# Patient Record
Sex: Female | Born: 1965 | Race: Black or African American | Hispanic: No | Marital: Married | Smoking: Never smoker
Health system: Southern US, Community
[De-identification: ages and names within clinical notes are randomized; demographics above are authoritative.]

## PROBLEM LIST (undated history)

## (undated) DIAGNOSIS — B3789 Other sites of candidiasis: Secondary | ICD-10-CM

## (undated) DIAGNOSIS — R102 Pelvic and perineal pain: Secondary | ICD-10-CM

## (undated) DIAGNOSIS — R358 Other polyuria: Secondary | ICD-10-CM

## (undated) DIAGNOSIS — O9102 Infection of nipple associated with the puerperium: Secondary | ICD-10-CM

## (undated) DIAGNOSIS — N946 Dysmenorrhea, unspecified: Secondary | ICD-10-CM

## (undated) HISTORY — DX: Dysmenorrhea, unspecified: N94.6

## (undated) HISTORY — DX: Other sites of candidiasis: B37.89

## (undated) HISTORY — PX: WISDOM TOOTH EXTRACTION: SHX21

## (undated) HISTORY — DX: Pelvic and perineal pain: R10.2

## (undated) HISTORY — PX: DILATION AND CURETTAGE OF UTERUS: SHX78

## (undated) HISTORY — DX: Other polyuria: R35.8

## (undated) HISTORY — DX: Infection of nipple associated with the puerperium: O91.02

---

## 2002-12-07 ENCOUNTER — Emergency Department (HOSPITAL_COMMUNITY): Admission: EM | Admit: 2002-12-07 | Discharge: 2002-12-07 | Payer: Self-pay | Admitting: Emergency Medicine

## 2004-03-31 ENCOUNTER — Ambulatory Visit (HOSPITAL_COMMUNITY): Admission: RE | Admit: 2004-03-31 | Discharge: 2004-03-31 | Payer: Self-pay | Admitting: Obstetrics and Gynecology

## 2004-08-16 ENCOUNTER — Inpatient Hospital Stay (HOSPITAL_COMMUNITY): Admission: AD | Admit: 2004-08-16 | Discharge: 2004-08-17 | Payer: Self-pay | Admitting: Obstetrics and Gynecology

## 2004-08-21 ENCOUNTER — Inpatient Hospital Stay (HOSPITAL_COMMUNITY): Admission: AD | Admit: 2004-08-21 | Discharge: 2004-08-23 | Payer: Self-pay | Admitting: Obstetrics and Gynecology

## 2004-08-28 ENCOUNTER — Inpatient Hospital Stay (HOSPITAL_COMMUNITY): Admission: AD | Admit: 2004-08-28 | Discharge: 2004-08-28 | Payer: Self-pay | Admitting: Obstetrics and Gynecology

## 2004-08-29 DIAGNOSIS — R3589 Other polyuria: Secondary | ICD-10-CM

## 2004-08-29 HISTORY — DX: Other polyuria: R35.89

## 2004-09-21 DIAGNOSIS — R102 Pelvic and perineal pain: Secondary | ICD-10-CM

## 2004-09-21 HISTORY — DX: Pelvic and perineal pain: R10.2

## 2004-10-23 ENCOUNTER — Other Ambulatory Visit: Admission: RE | Admit: 2004-10-23 | Discharge: 2004-10-23 | Payer: Self-pay | Admitting: Obstetrics and Gynecology

## 2005-03-09 DIAGNOSIS — N946 Dysmenorrhea, unspecified: Secondary | ICD-10-CM

## 2005-03-09 HISTORY — DX: Dysmenorrhea, unspecified: N94.6

## 2009-07-11 ENCOUNTER — Emergency Department (HOSPITAL_BASED_OUTPATIENT_CLINIC_OR_DEPARTMENT_OTHER): Admission: EM | Admit: 2009-07-11 | Discharge: 2009-07-11 | Payer: Self-pay | Admitting: Emergency Medicine

## 2010-12-03 ENCOUNTER — Emergency Department (HOSPITAL_BASED_OUTPATIENT_CLINIC_OR_DEPARTMENT_OTHER)
Admission: EM | Admit: 2010-12-03 | Discharge: 2010-12-04 | Disposition: A | Discharge status: Home / Self Care | Payer: Self-pay | Attending: Emergency Medicine | Admitting: Emergency Medicine

## 2010-12-03 DIAGNOSIS — R079 Chest pain, unspecified: Secondary | ICD-10-CM | POA: Insufficient documentation

## 2010-12-03 DIAGNOSIS — R252 Cramp and spasm: Secondary | ICD-10-CM | POA: Insufficient documentation

## 2010-12-04 ENCOUNTER — Emergency Department (INDEPENDENT_AMBULATORY_CARE_PROVIDER_SITE_OTHER): Payer: Self-pay

## 2010-12-04 DIAGNOSIS — R079 Chest pain, unspecified: Secondary | ICD-10-CM

## 2010-12-04 LAB — BASIC METABOLIC PANEL
GFR calc non Af Amer: >60 mL/min (ref >60)
Potassium: 4 mEq/L (ref 3.5–5.1)

## 2010-12-04 LAB — CBC
Hemoglobin: 12.3 g/dL (ref 12.0–15.0)
MCH: 30.8 pg (ref 26.0–34.0)
RDW: 11.8 % (ref 11.5–15.5)
WBC: 5.6 10*3/uL (ref 4.0–10.5)

## 2010-12-04 LAB — POCT CARDIAC MARKERS: Troponin i, poc: <0.05 ng/mL (ref 0.00–0.09)

## 2011-02-06 NOTE — H&P (Signed)
NAMERAYETTE, MOGG NO.:  [id number]   MEDICAL RECORD NO.:  [id number]          PATIENT TYPE:  MAT   LOCATION:  [facility]                          FACILITY:  [facility]   PHYSICIAN:  Osborn Coho, M.D.   DATE OF BIRTH:  10/28/1965   DATE OF ADMISSION:  08/21/2004  DATE OF DISCHARGE:                                HISTORY & PHYSICAL   HISTORY OF PRESENT ILLNESS:  This is a 45 year old gravida 3 para 1-0-1-1 at  63 and two-sevenths weeks who presents for labor evaluation.  She denies  leaking or bleeding, reports positive fetal movement.  Pregnancy has been  followed by Dr. Pennie Rushing and remarkable for:  1.  Unsure LMP.  2.  AMA.  3.  History of postpartum depression.  4.  History of premature labor.  5.  Migraines.  6.  Group B strep negative.   OBSTETRICAL HISTORY:  Remarkable for an unsure type of abortion - it is not  indicated whether it is elective or spontaneous - in 2001 requiring a D&C.  She had a vaginal delivery in 2002 of  female infant at [redacted] weeks gestation  weighing 8 pounds 14 ounces, remarkable for preterm labor at 22 weeks which  dilated to 1 cm, but then she delivered at postterm.   MEDICAL HISTORY:  Remarkable for history of postpartum depression after her  last baby, history of premature labor, childhood varicella, history of  anemia as a teenager, occasional UTIs, history of migraines for which she  requires no medications, history of questionable depression in the past.   FAMILY HISTORY:  Remarkable for mother with MI, father and mother with  hypertension, cousin with asthma, grandmother and aunt with diabetes,  sisters with thyroid dysfunction of unknown type, cousin with breast cancer,  and a cousin with schizophrenia.   GENETIC HISTORY:  Remarkable for advanced maternal age of 56, father-of-the-  baby's sister with sickle cell trait, and several family members with twins.   SOCIAL HISTORY:  Remarkable for a D&C in 2001 and childbirth in  2002.   SOCIAL HISTORY:  The patient is married to [facility] who is  involved and supportive.  She is of the [place] faith.  She denies any  alcohol, tobacco, or drug use.   ALLERGIES:  None.   MEDICATIONS:  Prenatal vitamins.   PRENATAL LABORATORY DATA:  Hemoglobin 12.6, platelets 410.  Blood type B  positive, antibody screen negative.  RPR nonreactive.  Rubella immune.  Hepatitis negative,  HIV negative.  Cystic fibrosis negative.  Parvo  negative.  Gonorrhea negative, chlamydia negative.  Group B strep negative.   HISTORY OF CURRENT PREGNANCY:  The patient entered care at [redacted] weeks  gestation.  She declined amniocentesis.  She did have nuchal translucency  done at [facility].  She had a normal ultrasound at 20 weeks.  She had some  increased weight gain secondary to dietary indiscretions.  She had some  pelvic pressure at 23 weeks but no preterm labor.  She had a negative group  B strep.   OBJECTIVE DATA:  VITAL SIGNS:  Stable,  afebrile.  HEENT:  Within normal limits.  Thyroid normal, not enlarged.  CHEST:  Clear to auscultation.  HEART:  Regular rate and rhythm.  ABDOMEN:  Gravid at 40 cm, vertex to Leopold's.  EFM shows a reactive fetal  heart rate with uterine contractions every 2 minutes.  PELVIC:  Cervix was initially 3 cm, 80%, and -2, and 3 hours later was 4.5  cm, 90% effaced, and -1 to -2 station with a vertex presentation.  EXTREMITIES:  Within normal limits.   ASSESSMENT:  1.  Intrauterine pregnancy at 40 and two-sevenths weeks.  2.  Active labor.  3.  Group B streptococcus negative.  4.  Desires epidural.   PLAN:  1.  Admit to birthing suites per Dr. Su Hilt.  2.  Routine M.D. orders.  3.  Epidural p.r.n.     Mari   MLW/MEDQ  D:  08/21/2004  T:  08/21/2004  Job:  [id number]

## 2011-12-17 ENCOUNTER — Encounter: Payer: Self-pay | Admitting: Obstetrics and Gynecology

## 2011-12-25 ENCOUNTER — Ambulatory Visit: Payer: Self-pay | Admitting: Obstetrics and Gynecology

## 2012-01-29 ENCOUNTER — Telehealth: Payer: Self-pay | Admitting: Obstetrics and Gynecology

## 2012-01-29 NOTE — Telephone Encounter (Signed)
Lm on vm to cb per telephone call.  

## 2012-01-29 NOTE — Telephone Encounter (Signed)
Triage/pts cht is in storage!

## 2012-04-28 ENCOUNTER — Encounter: Payer: Self-pay | Admitting: Obstetrics and Gynecology

## 2012-05-06 ENCOUNTER — Ambulatory Visit: Payer: BC Managed Care – PPO | Admitting: Obstetrics and Gynecology

## 2012-05-06 NOTE — Progress Notes (Unsigned)
EPIC DOWN 3:50 PM 05/06/12

## 2012-07-15 ENCOUNTER — Ambulatory Visit (INDEPENDENT_AMBULATORY_CARE_PROVIDER_SITE_OTHER): Payer: BC Managed Care – PPO | Admitting: Obstetrics and Gynecology

## 2012-07-15 ENCOUNTER — Encounter: Payer: Self-pay | Admitting: Obstetrics and Gynecology

## 2012-07-15 VITALS — BP 120/78 | HR 80 | Wt 193.0 lb

## 2012-07-15 DIAGNOSIS — Z30433 Encounter for removal and reinsertion of intrauterine contraceptive device: Secondary | ICD-10-CM

## 2012-07-15 DIAGNOSIS — Z113 Encounter for screening for infections with a predominantly sexual mode of transmission: Secondary | ICD-10-CM

## 2012-07-15 DIAGNOSIS — IMO0001 Reserved for inherently not codable concepts without codable children: Secondary | ICD-10-CM

## 2012-07-15 DIAGNOSIS — Z309 Encounter for contraceptive management, unspecified: Secondary | ICD-10-CM

## 2012-07-15 MED ORDER — LEVONORGESTREL 20 MCG/24HR IU IUD
INTRAUTERINE_SYSTEM | Freq: Once | INTRAUTERINE | Status: AC
  Administered 2012-07-15: 1 via INTRAUTERINE

## 2012-07-15 NOTE — Patient Instructions (Addendum)
Call [facility] [phone]:  -for temperature of 100.4 degrees Fahrenheit or more -pain not improved with over the counter pain medications (Ibuprofen, Advil, Aleve,        Tylenol or acetaminophen) -for excessive bleeding (more than a usual period) -for any other concerns  Do not place anything in your vagina for the next 7 days    

## 2012-07-15 NOTE — Progress Notes (Signed)
IUD INSERTION NOTE  Jean Dominguez is a 46 y.o. female G3P2 who presents for IUD removal and re- insertion.  Consent signed after risks and benefits were reviewed including but not limited to bleeding, infection, expulsion and risk of uterine perforation that may require an additional procedure for removal.  LMP: No LMP recorded. Patient is not currently having periods (Reason: IUD). UPT: negative  Uterus assessed for size and position IUD removed without difficulty per protocol Prepped with Hibiclens  Tenaculum placed on anterior lip of cervix after Hurricane gel was applied Uterus sounded at  7 cm Insertion of MIRENA IUD per protocol without any complications Strings trimmed  Assessment:  IUD Removal and Mirena Re-Insertion  Plan:  1. Patient instructed to call with oral temperature of 100.4 degrees Fahrenheit or more, excessive bleeding or pain that is not relieved with OTC analgesia taken as directed  2. Patient instructed on how  to check IUD strings and encouraged to do so after each menstrual cycle  3. Advised not to place anything in vagina or have sexual intercourse for 7 days  4. Follow-up: 4  weeks   Evadean Sproule PA-C 07/15/2012 11:32 AM

## 2012-07-20 LAB — GC/CHLAMYDIA PROBE AMP
CT Probe RNA: NEGATIVE
GC Probe RNA: NEGATIVE

## 2012-08-16 ENCOUNTER — Encounter: Payer: Self-pay | Admitting: Obstetrics and Gynecology

## 2012-08-16 ENCOUNTER — Ambulatory Visit (INDEPENDENT_AMBULATORY_CARE_PROVIDER_SITE_OTHER): Payer: BC Managed Care – PPO | Admitting: Obstetrics and Gynecology

## 2012-08-16 VITALS — BP 120/64 | HR 70 | Resp 16 | Ht 68.0 in | Wt 191.0 lb

## 2012-08-16 DIAGNOSIS — Z124 Encounter for screening for malignant neoplasm of cervix: Secondary | ICD-10-CM

## 2012-08-16 DIAGNOSIS — Z975 Presence of (intrauterine) contraceptive device: Secondary | ICD-10-CM

## 2012-08-16 NOTE — Progress Notes (Signed)
Contraception Mirena IUD Last pap 2010 WNL  Last Mammo 2011 WNL Last Colonoscopy None Last Dexa Scan None Primary MD None Abuse at Home None  No complaints  Filed Vitals:   08/16/12 1502  BP: 120/64  Pulse: 70  Resp: 16   ROS: noncontributory  Physical Examination: General appearance - alert, well appearing, and in no distress Neck - supple, no significant adenopathy Chest - clear to auscultation, no wheezes, rales or rhonchi, symmetric air entry Heart - normal rate and regular rhythm Abdomen - soft, nontender, nondistended, no masses or organomegaly Breasts - breasts appear normal, no suspicious masses, no skin or nipple changes or axillary nodes Pelvic - normal external genitalia, vulva, vagina, cervix, uterus and adnexa, IUD string visible Back exam - no CVAT Extremities - no edema, redness or tenderness in the calves or thighs  A/P Pap today mammo Refer to PCP Dr. Allyne Gee Note for school

## 2012-08-17 LAB — PAP IG W/ RFLX HPV ASCU

## 2014-04-17 ENCOUNTER — Ambulatory Visit (INDEPENDENT_AMBULATORY_CARE_PROVIDER_SITE_OTHER): Payer: BC Managed Care – PPO | Admitting: Podiatry

## 2014-04-17 ENCOUNTER — Encounter: Payer: Self-pay | Admitting: Podiatry

## 2014-04-17 VITALS — BP 137/75 | HR 66 | Ht 68.0 in | Wt 203.0 lb

## 2014-04-17 DIAGNOSIS — M79609 Pain in unspecified limb: Secondary | ICD-10-CM

## 2014-04-17 DIAGNOSIS — M722 Plantar fascial fibromatosis: Secondary | ICD-10-CM

## 2014-04-17 DIAGNOSIS — M216X9 Other acquired deformities of unspecified foot: Secondary | ICD-10-CM

## 2014-04-17 DIAGNOSIS — M21969 Unspecified acquired deformity of unspecified lower leg: Secondary | ICD-10-CM | POA: Insufficient documentation

## 2014-04-17 NOTE — Progress Notes (Signed)
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  Started to be tired with aching feet off and on for a year, and progressively getting worse. In a couple of weeks, she will be going back to work. During the summer while she was not on her feet as much, still it did not get better. Especially after exercise, feet are very painful.  Been getting good shoes. In the morning gets pain and also after been on feet. Aching feet but not enough to stop her from doing things. Not taking any medication. When she got a shot from sinus problem her feet felt better.  Mostly on heel and sometimes in arch R>L.   Objective: Neurovascular status are within normal. Dermatologic: Long nails without any skin lesions. Orthopedic: High arched cavus type foot. Excess sagittal plane motion of the first ray upon loading of forefoot bilateral.  X-ray: Supinated high arched cavus foot with mild Metatarsus adductus bilateral. No significant osseous deviations noted in X-ray.   Assessment: Plantar fasciitis bilateral. Metatarsal deformity bilateral. Cavus foot bilateral.  Plan: Reviewed findings. Night Splint dispensed x 2.  Both feet casted for Orthotics.

## 2014-04-17 NOTE — Patient Instructions (Signed)
Seen for painful heel both feet. Treated for plantar fasciitis with Night Splints and [employer].

## 2014-07-23 ENCOUNTER — Encounter: Payer: Self-pay | Admitting: Podiatry

## 2015-11-19 ENCOUNTER — Encounter (HOSPITAL_BASED_OUTPATIENT_CLINIC_OR_DEPARTMENT_OTHER): Payer: Self-pay

## 2015-11-19 ENCOUNTER — Emergency Department (HOSPITAL_BASED_OUTPATIENT_CLINIC_OR_DEPARTMENT_OTHER)
Admission: EM | Admit: 2015-11-19 | Discharge: 2015-11-20 | Disposition: A | Discharge status: Home / Self Care | Payer: BC Managed Care – PPO | Attending: Emergency Medicine | Admitting: Emergency Medicine

## 2015-11-19 ENCOUNTER — Emergency Department (HOSPITAL_BASED_OUTPATIENT_CLINIC_OR_DEPARTMENT_OTHER): Payer: BC Managed Care – PPO

## 2015-11-19 DIAGNOSIS — R51 Headache: Secondary | ICD-10-CM | POA: Diagnosis not present

## 2015-11-19 DIAGNOSIS — R200 Anesthesia of skin: Secondary | ICD-10-CM | POA: Insufficient documentation

## 2015-11-19 LAB — DIFFERENTIAL
BASOS PCT: 1 %
Basophils Absolute: 0 10*3/uL (ref 0.0–0.1)
EOS PCT: 3 %
Eosinophils Absolute: 0.2 10*3/uL (ref 0.0–0.7)
Lymphocytes Relative: 48 %
Lymphs Abs: 2.9 10*3/uL (ref 0.7–4.0)
MONO ABS: 0.6 10*3/uL (ref 0.1–1.0)
Monocytes Relative: 9 %
NEUTROS ABS: 2.4 10*3/uL (ref 1.7–7.7)
NEUTROS PCT: 39 %

## 2015-11-19 LAB — CBC
HCT: 38.1 % (ref 36.0–46.0)
Hemoglobin: 13.1 g/dL (ref 12.0–15.0)
MCH: 31 pg (ref 26.0–34.0)
MCHC: 34.4 g/dL (ref 30.0–36.0)
MCV: 90.1 fL (ref 78.0–100.0)
PLATELETS: 359 10*3/uL (ref 150–400)
RBC: 4.23 MIL/uL (ref 3.87–5.11)
RDW: 12.2 % (ref 11.5–15.5)
WBC: 6.1 10*3/uL (ref 4.0–10.5)

## 2015-11-19 NOTE — ED Notes (Signed)
Pt states she drove herself here. While at a funeral an hour ago she felt numbness in her face. She is ambulatory. Neuro intact.

## 2015-11-19 NOTE — ED Notes (Signed)
Pt is not sitting in the waiting area. No witness of her leaving.

## 2015-11-19 NOTE — ED Provider Notes (Signed)
TIME SEEN: 11:30 PM  CHIEF COMPLAINT: Left-sided facial and arm numbness  HPI: Pt is a 50 y.o. female with history of migraines and sinus headaches that she states she's had for "years" who presents to the emergency department with left-sided facial numbness that started at 6:30 PM while at a funeral this migrated down her arm on the left side. No weakness. No chest pain or shortness of breath. No changes in her speech. No changes in vision or hearing. Denies any headache. Not on anticoagulation. Denies any recent head injury. Has never had a complicated migraine before.  ROS: See HPI Constitutional: no fever  Eyes: no drainage  ENT: no runny nose   Cardiovascular:  no chest pain  Resp: no SOB  GI: no vomiting GU: no dysuria Integumentary: no rash  Allergy: no hives  Musculoskeletal: no leg swelling  Neurological: no slurred speech ROS otherwise negative  PAST MEDICAL HISTORY/PAST SURGICAL HISTORY:  Past Medical History  Diagnosis Date  . Dysmenorrhea 03/09/05  . Yeast infection of nipple, postpartum     breast candida  . Diuresis 08/29/04  . Suprapubic pressure 2006     MEDICATIONS:  Prior to Admission medications   Medication Sig Start Date End Date Taking? Authorizing Provider  fexofenadine (ALLEGRA) 180 MG tablet Take 180 mg by mouth as needed.     Historical Provider, MD  fluticasone Aleda Grana) 50 MCG/ACT nasal spray  04/12/14   Historical Provider, MD  Pseudoephedrine-DM-GG (SUDAFED COUGH PO) Take by mouth.    Historical Provider, MD  Vitamin D, Ergocalciferol, (DRISDOL) 50000 UNITS CAPS capsule  04/12/14   Historical Provider, MD    ALLERGIES:  Allergies  Allergen Reactions  . Macrobid [Nitrofurantoin Macrocrystal] Itching  . Shellfish Allergy     SOCIAL HISTORY:  Social History  Substance Use Topics  . Smoking status: Never Smoker   . Smokeless tobacco: Never Used  . Alcohol Use: Yes    FAMILY HISTORY: Family History  Problem Relation Age of Onset  . Heart  disease Mother     MI  . Hypertension Mother   . Hypertension Father     EXAM: BP 116/57 mmHg  Pulse 72  Temp(Src) 98.5 F (36.9 C) (Oral)  Resp 20  Ht  (1.727 m)  Wt 190 lb (86.183 kg)  BMI 28.90 kg/m2  SpO2 100% CONSTITUTIONAL: Alert and oriented and responds appropriately to questions. Well-appearing; well-nourished HEAD: Normocephalic EYES: Conjunctivae clear, PERRL, extraocular movements intact ENT: normal nose; no rhinorrhea; moist mucous membranes NECK: Supple, no meningismus, no LAD  CARD: RRR; S1 and S2 appreciated; no murmurs, no clicks, no rubs, no gallops RESP: Normal chest excursion without splinting or tachypnea; breath sounds clear and equal bilaterally; no wheezes, no rhonchi, no rales, no hypoxia or respiratory distress, speaking full sentences ABD/GI: Normal bowel sounds; non-distended; soft, non-tender, no rebound, no guarding, no peritoneal signs BACK:  The back appears normal and is non-tender to palpation, there is no CVA tenderness EXT: Normal ROM in all joints; non-tender to palpation; no edema; normal capillary refill; no cyanosis, no calf tenderness or swelling    SKIN: Normal color for age and race; warm; no rash NEURO: Moves all extremities equally, reports that she feels decreased sensation in the left face and reports she feels tingling in the left arm but states normal sensation when I palpate the left and right arms, normal sensation in bilateral lower extremities, cranial nerves II through XII intact, strength is 5/5 in all 4 extremities, no aphasia or  dysarthria. PSYCH: The patient's mood and manner are appropriate. Grooming and personal hygiene are appropriate.  MEDICAL DECISION MAKING: Patient here with concerning symptoms of left-sided facial numbness and left arm numbness. She has no history of hypertension, diabetes, hyperlipidemia, tobacco use, CAD, prior stroke. No prior history of complicated migraine and she denies headache today. We'll  proceed with labs, CT of her head, EKG. Her NIH stroke scale would be between 1 and 2 and have discussed with her that she is right within the window for TPA. Discussed risk and benefits. Given her low NIH stroke scale I do not feel she is a candidate for TPA and she feels like the tingling in her left hand is improving. She agrees with this plan and states she would prefer to not have TPA.  ED PROGRESS: Patient's labs, head CT unremarkable. Reports that the tingling in her left hand is completely resolved but she is still having tingling throughout the rest of the left arm and numbness in the left face. Still not having a headache. We'll discuss with neurology on call for further recommendations.   1:30 AM  D/w Dr. Laurell Josephs with neurology who recommends MRI brain without contrast tonight given continued symptoms.   1:40 AM  D/w Dr. Bebe Shaggy with ED who agrees to accept patient.  Pt and husband would like transfer to [facility] for MRI by POV.  I feel comfortable with this plan for her to go by private vehicle. Have offered her and her husband to go by [facility] several times but they decline. They state they will go straight to [facility]. Plan is to receive a brain MRI without contrast. If this is normal she will be discharged home with outpatient follow-up.     EKG Interpretation  Date/Time:  Tuesday November 19 2015 23:56:45 EST Ventricular Rate:  66 PR Interval:  123 QRS Duration: 86 QT Interval:  424 QTC Calculation: 444 R Axis:   52 Text Interpretation:  Sinus rhythm Baseline wander in lead(s) III No significant change since last tracing Confirmed by WARD,  DO, KRISTEN [phone]) on 11/20/2015 12:18:35 AM        Layla Maw Ward, DO 11/20/15 [id number]

## 2015-11-19 NOTE — ED Notes (Signed)
Pt was at a funeral and started having numbness from jaw down neck and through shoulder on left side that started about 2 hours ago.  The numbness is progressing down arm.  Pt denies SOB, denies chest pain, pt's neuro checks are normal in triage other than numbness on left side.

## 2015-11-19 NOTE — ED Notes (Signed)
She is anxious. Asked me if she should leave and see her MD tomorrow. I advised her to stay and be seen by the MD.

## 2015-11-19 NOTE — ED Notes (Signed)
Spoke with Dr. Clarene Duke about patient and whether she needs a CT head while waiting.  Pt does not have any risk factors and other than numbness to face and arm, pt is neurologically WNL. Dr. Clarene Duke declines need for CT at this time.

## 2015-11-20 ENCOUNTER — Emergency Department (HOSPITAL_COMMUNITY): Payer: BC Managed Care – PPO

## 2015-11-20 ENCOUNTER — Encounter (HOSPITAL_COMMUNITY): Payer: Self-pay

## 2015-11-20 LAB — COMPREHENSIVE METABOLIC PANEL
ALBUMIN: 4.6 g/dL (ref 3.5–5.0)
ALK PHOS: 61 U/L (ref 38–126)
ALT: 17 U/L (ref 14–54)
ANION GAP: 9 (ref 5–15)
AST: 19 U/L (ref 15–41)
BILIRUBIN TOTAL: 0.6 mg/dL (ref 0.3–1.2)
BUN: 9 mg/dL (ref 6–20)
CALCIUM: 9.5 mg/dL (ref 8.9–10.3)
CO2: 26 mmol/L (ref 22–32)
Chloride: 102 mmol/L (ref 101–111)
Creatinine, Ser: 0.87 mg/dL (ref 0.44–1.00)
GFR calc Af Amer: >60 mL/min (ref >60)
GFR calc non Af Amer: >60 mL/min (ref >60)
GLUCOSE: 90 mg/dL (ref 65–99)
Potassium: 3.4 mmol/L — ABNORMAL LOW (ref 3.5–5.1)
Sodium: 137 mmol/L (ref 135–145)
TOTAL PROTEIN: 8 g/dL (ref 6.5–8.1)

## 2015-11-20 LAB — URINALYSIS, ROUTINE W REFLEX MICROSCOPIC
Bilirubin Urine: NEGATIVE
GLUCOSE, UA: NEGATIVE mg/dL
HGB URINE DIPSTICK: NEGATIVE
KETONES UR: NEGATIVE mg/dL
LEUKOCYTES UA: NEGATIVE
Nitrite: NEGATIVE
PROTEIN: NEGATIVE mg/dL
Specific Gravity, Urine: 1.009 (ref 1.005–1.030)
pH: 7.5 (ref 5.0–8.0)

## 2015-11-20 LAB — TROPONIN I

## 2015-11-20 LAB — PROTIME-INR
INR: 0.95 (ref 0.00–1.49)
PROTHROMBIN TIME: 12.9 s (ref 11.6–15.2)

## 2015-11-20 LAB — APTT: aPTT: 30 seconds (ref 24–37)

## 2015-11-20 NOTE — ED Notes (Signed)
Pt from [facility] accompanied by her husband; was seen there for numbness to left side of her face and it radiates down to her left arm that started at 1830 last night. Pt also reports HA behind her left eye. She was sent here from [facility] to have MRI done. Pt A&Ox4, speaking clear complete sentences and no neuro deficits noted upon arrival to [facility].

## 2015-11-20 NOTE — ED Notes (Signed)
Pt taken to MRI  

## 2015-11-20 NOTE — ED Provider Notes (Signed)
Pt sent from [facility] for MRI imaging Pt reports left facial numbness and left UE numbness No focal weakness noted No arm drift No facial droop Mri pending at this time   Zadie Rhine, MD 11/20/15 [phone]

## 2015-11-20 NOTE — ED Provider Notes (Signed)
Mri negative Pt without any new symptoms Will d/c home F/u info given to patient   Zadie Rhine, MD 11/20/15 [phone]

## 2015-12-02 ENCOUNTER — Telehealth (HOSPITAL_BASED_OUTPATIENT_CLINIC_OR_DEPARTMENT_OTHER): Payer: Self-pay | Admitting: Emergency Medicine

## 2015-12-03 ENCOUNTER — Telehealth (HOSPITAL_BASED_OUTPATIENT_CLINIC_OR_DEPARTMENT_OTHER): Payer: Self-pay | Admitting: Emergency Medicine

## 2016-07-08 ENCOUNTER — Encounter: Payer: Self-pay | Admitting: Internal Medicine

## 2016-07-08 ENCOUNTER — Ambulatory Visit (INDEPENDENT_AMBULATORY_CARE_PROVIDER_SITE_OTHER): Payer: BC Managed Care – PPO | Admitting: Internal Medicine

## 2016-07-08 VITALS — BP 118/80 | HR 73 | Temp 98.1°F | Resp 16 | Ht 68.0 in | Wt 194.0 lb

## 2016-07-08 DIAGNOSIS — J309 Allergic rhinitis, unspecified: Secondary | ICD-10-CM | POA: Insufficient documentation

## 2016-07-08 DIAGNOSIS — Z23 Encounter for immunization: Secondary | ICD-10-CM

## 2016-07-08 DIAGNOSIS — R42 Dizziness and giddiness: Secondary | ICD-10-CM | POA: Diagnosis not present

## 2016-07-08 DIAGNOSIS — J3089 Other allergic rhinitis: Secondary | ICD-10-CM

## 2016-07-08 NOTE — Patient Instructions (Addendum)
  Flu vaccine administered today.   Medications reviewed and updated.  No changes recommended at this time.   Please followup annually for a physical exam

## 2016-07-08 NOTE — Assessment & Plan Note (Signed)
Dizziness/lightheadedness/off balanced feeling - likely a combination of bifocal glasses, allergies, dehydration or possible postural lightheadedness at times She has tried different things and her symptoms are getting better No concerning neurological or cardiac symptoms She will continue to work on the above and return if symptoms worsen/do not improve

## 2016-07-08 NOTE — Progress Notes (Signed)
Pre visit review using our clinic review tool, if applicable. No additional management support is needed unless otherwise documented below in the visit note. 

## 2016-07-08 NOTE — Progress Notes (Signed)
Subjective:    Patient ID: Jean Dominguez, female    DOB: 1966/03/28, 50 y.o.   MRN: [id number]  HPI She is here to establish with a new pcp.    She feels off balanced at times and wonders if it is related to allergies or her vision.  She describes the feeling of being off balance is somewhat like dizziness/lightheadedness or just feeling off balanced. 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 She has noticed a with sitting and standing and there is no specific pattern.  She has always worn contacts, but as she has gotten older has developed worsening vision for reading. Not long ago she got glasses that are bifocals and she wonders if this is somewhat better cause for her symptoms. She was initially wearing the contacts during the day and then putting the glasses on at night. Over the past few weeks she has tried wearing the glasses continuously and sometimes she can have the unbalanced or dizzy feeling with the glasses. She thinks that is improving.  She also has chronic seasonal allergies. She is taking Flonase, but does not want to take any other medication. She is trying to do some nocturnal remedies and is hoping that will help. She wonders if the dizziness/lightheadedness/feeling off balanced feeling is related to allergies in her ears feeling congested.  Her last PCP told her at one point she was close to being a diabetic. She will return in 1 month for follow-up and her doctor denied that she was close to being a diabetic. She was concerned if this was a possibility and this was potentially causing her symptoms.  At times they dizziness/lightheadedness can be worse with going from a sitting to standing position. 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 She is trying to increase  her fluids.  She did see her previous primary care physician regarding the dizziness/lightheadedness/unbalanced feeling. She did have workup including blood work earlier this year. Her symptoms started approximately one year ago. At one point she had some weakness on one side of her body was concerned she was having a stroke. She did have imaging at that time and everything was normal. She has not had any recurrence of the weakness and believes it may have been secondary to massage therapy she was getting for tight muscles in her neck and upper back.    Medications and allergies reviewed with patient and updated if appropriate.  Patient Active Problem List   Diagnosis Date Noted  . Allergic rhinitis 07/08/2016  . Dizziness 07/08/2016  . Plantar fasciitis, bilateral 04/17/2014  . 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 IUD contraception 08/16/2012    Current Outpatient Prescriptions on File Prior to Visit  Medication Sig Dispense Refill  . Cholecalciferol (VITAMIN D3) 2000 units capsule Take 1 capsule by mouth daily.     No current facility-administered medications on file prior to visit.     Past Medical History:  Diagnosis Date  . Diuresis 08/29/04  . Dysmenorrhea 03/09/05  . Suprapubic pressure 2006   . Yeast infection of nipple, postpartum    breast candida    Past Surgical History:  Procedure Laterality Date  . DILATION AND CURETTAGE OF UTERUS    . WISDOM TOOTH EXTRACTION      Social History  Social History  . Marital status: Married    Spouse name: N/A  . Number of children: N/A  . Years of education: N/A   Social History Main Topics  . Smoking status: Never Smoker  . Smokeless tobacco: Never Used  . Alcohol use Yes  . Drug use: No  . Sexual activity: Yes    Birth control/ protection: Condom, IUD   Other Topics Concern  . None   Social History Narrative   Teacher       Family History  Problem Relation Age of Onset  . Heart disease Mother     MI  .  Hypertension Mother   . Hypertension Father     Review of Systems  Constitutional: Negative for chills and fever.  HENT: Positive for congestion and postnasal drip.   Eyes: Negative for visual disturbance.  Respiratory: Positive for cough (dry, ? PND). Negative for shortness of breath and wheezing.   Cardiovascular: Negative for chest pain, palpitations and leg swelling.  Gastrointestinal: Negative for abdominal pain, blood in stool, constipation and diarrhea.       Occ GERD  Neurological: Positive for dizziness, light-headedness and headaches (sinus related).  Psychiatric/Behavioral: The patient is nervous/anxious.        Objective:   Vitals:   07/08/16 1431  BP: 118/80  Pulse: 73  Resp: 16  Temp: 98.1 F (36.7 C)   Filed Weights   07/08/16 1431  Weight: 194 lb (88 kg)   Body mass index is 29.5 kg/m.   Physical Exam Constitutional: She appears well-developed and well-nourished. No distress.  HENT:  Head: Normocephalic and atraumatic.  Right Ear: External ear normal. Normal ear canal and TM Left Ear: External ear normal.  Normal ear canal and TM Mouth/Throat: Oropharynx is clear and moist.  Eyes: Conjunctivae and EOM are normal.  Neck: Neck supple. No tracheal deviation present. No thyromegaly present.  No carotid bruit  Cardiovascular: Normal rate, regular rhythm and normal heart sounds.   No murmur heard.  No edema. Pulmonary/Chest: Effort normal and breath sounds normal. No respiratory distress. She has no wheezes. She has no rales.  Breast: deferred to Gyn Abdominal: Soft. She exhibits no distension. There is no tenderness.  Lymphadenopathy: She has no cervical adenopathy.  Neurologic: Gait normal, cranial nerves II-XII intact, normal sensation and strength all extremities Skin: Skin is warm and dry. She is not diaphoretic.  Psychiatric: She has a normal mood and affect. Her behavior is normal.         Assessment & Plan:   See Problem List for  Assessment and Plan of chronic medical problems.    Follow up annually

## 2016-07-08 NOTE — Assessment & Plan Note (Addendum)
Taking flonase Has not tolerated claritin Will try natural remedies

## 2018-10-02 NOTE — Progress Notes (Signed)
    Subjective:    Patient ID: Jean Dominguez, female    DOB: 1965/12/30, 53 y.o.   MRN: [id number]  HPI    Patient Active Problem List   Diagnosis Date Noted  . Allergic rhinitis 07/08/2016  . Dizziness 07/08/2016  . Plantar fasciitis, bilateral 04/17/2014  . 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 IUD contraception 08/16/2012    Current Outpatient Medications on File Prior to Visit  Medication Sig Dispense Refill  . Cholecalciferol (VITAMIN D3) 2000 units capsule Take 1 capsule by mouth daily.    . fluticasone (FLONASE) 50 MCG/ACT nasal spray Place into both nostrils daily.     No current facility-administered medications on file prior to visit.     Past Medical History:  Diagnosis Date  . Diuresis 08/29/04  . Dysmenorrhea 03/09/05  . Suprapubic pressure 2006   . Yeast infection of nipple, postpartum    breast candida    Past Surgical History:  Procedure Laterality Date  . DILATION AND CURETTAGE OF UTERUS    . WISDOM TOOTH EXTRACTION      Social History   Socioeconomic History  . Marital status: Married    Spouse name: Not on file  . Number of children: Not on file  . Years of education: Not on file  . Highest education level: Not on file  Occupational History  . Not on file  Social Needs  . Financial resource strain: Not on file  . Food insecurity:    Worry: Not on file    Inability: Not on file  . Transportation needs:    Medical: Not on file    Non-medical: Not on file  Tobacco Use  . Smoking status: Never Smoker  . Smokeless tobacco: Never Used  Substance and Sexual Activity  . Alcohol use: Yes  . Drug use: No  . Sexual activity: Yes    Birth control/protection: Condom, I.U.D.  Lifestyle  . Physical activity:    Days per week: Not on file    Minutes per session: Not on file  . Stress: Not on file  Relationships  . Social connections:    Talks on phone: Not on file    Gets together: Not on file    Attends religious service: Not on file    Active  member of club or organization: Not on file    Attends meetings of clubs or organizations: Not on file    Relationship status: Not on file  Other Topics Concern  . Not on file  Social History Narrative   Teacher    Family History  Problem Relation Age of Onset  . Heart disease Mother        MI  . Hypertension Mother   . Hypertension Father     Review of Systems     Objective:  There were no vitals filed for this visit. There were no vitals filed for this visit. There is no height or weight on file to calculate BMI.  BP Readings from Last 3 Encounters:  07/08/16 118/80  11/20/15 134/69  04/17/14 137/75    Wt Readings from Last 3 Encounters:  07/08/16 194 lb (88 kg)  11/19/15 190 lb (86.2 kg)  04/17/14 203 lb (92.1 kg)     Physical Exam        Assessment & Plan:     This encounter was created in error - please disregard.

## 2018-10-03 ENCOUNTER — Encounter: Payer: BC Managed Care – PPO | Admitting: Internal Medicine

## 2018-10-13 NOTE — Progress Notes (Signed)
Subjective:    Patient ID: Jean Dominguez, female    DOB: [birth year], 53 y.o.   MRN: [id number]  HPI She is here for a physical exam.   She was last seen 06/2016, which was the only time I have seen her.    External hemorrhoids: She has had external hemorrhoids since the birth of her last child 9 years ago.  They have been inflamed for over the past few days after having more frequent, loose stools.  She is experiencing pain, itching and bleeding.  She has been taking warm showers and using Preparation H pads, which helped.  Hair loss: She has been experiencing hair loss on the top of her head.  She wondered if that was related to nutritional deficiency or aging.  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  She was unsure if it was related to low blood sugar, dehydration or her sinus issues.  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  She has tried allergy medications for her sinus congestion, but is not taking them regularly.  She typically does not feel the dizziness is much on the weekends.  She thinks the dizziness is related to movement.     Medications and allergies reviewed with patient and updated if appropriate.  Patient Active Problem List   Diagnosis Date Noted  . External hemorrhoids 10/14/2018  . Hair loss 10/14/2018  . Family history of diabetes mellitus 10/14/2018  . Allergic rhinitis 07/08/2016  . Dizziness 07/08/2016  . Plantar fasciitis, bilateral 04/17/2014  . 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 IUD contraception 08/16/2012    Current Outpatient Medications on File Prior to Visit  Medication Sig Dispense Refill  . Cholecalciferol (VITAMIN D3) 2000 units capsule Take 1 capsule by mouth daily.     No current facility-administered medications on file prior to  visit.     Past Medical History:  Diagnosis Date  . Diuresis 08/29/04  . Dysmenorrhea 03/09/05  . Suprapubic pressure 2006   . Yeast infection of nipple, postpartum    breast candida    Past Surgical History:  Procedure Laterality Date  . DILATION AND CURETTAGE OF UTERUS    . WISDOM TOOTH EXTRACTION      Social History   Socioeconomic History  . Marital status: Married    Spouse name: Not on file  . Number of children: Not on file  . Years of education: Not on file  . Highest education level: Not on file  Occupational History  . Not on file  Social Needs  . Financial resource strain: Not on file  . Food insecurity:    Worry: Not on file    Inability: Not on file  . Transportation needs:    Medical: Not on file    Non-medical: Not on file  Tobacco Use  . Smoking status: Never Smoker  . Smokeless tobacco: Never Used  Substance and Sexual Activity  . Alcohol use: Yes  . Drug use: No  . Sexual activity: Yes    Birth control/protection: Condom, I.U.D.  Lifestyle  . Physical activity:    Days per week: Not on file    Minutes per session: Not on file  . Stress: Not on file  Relationships  . Social connections:    Talks on phone:  Not on file    Gets together: Not on file    Attends religious service: Not on file    Active member of club or organization: Not on file    Attends meetings of clubs or organizations: Not on file    Relationship status: Not on file  Other Topics Concern  . Not on file  Social History Narrative   Teacher    Family History  Problem Relation Age of Onset  . Heart disease Mother        MI  . Hypertension Mother   . Hypertension Father   . Thyroid disease Sister     Review of Systems  Constitutional: Negative for chills and fever.  Eyes: Negative for visual disturbance.  Respiratory: Negative for cough, shortness of breath and wheezing.   Cardiovascular: Negative for chest pain, palpitations and leg swelling.  Gastrointestinal:  Positive for anal bleeding. Negative for abdominal pain, constipation, diarrhea and nausea.  Genitourinary: Negative for dysuria and hematuria.  Musculoskeletal: Negative for arthralgias.  Skin: Negative for rash.       Hair loss on top of head  Neurological: Positive for dizziness (transient but multiple a day, feels off balance) and headaches (sinus).  Psychiatric/Behavioral: Negative for dysphoric mood. The patient is not nervous/anxious.        Objective:   Vitals:   10/14/18 1540  BP: 134/90  Pulse: 67  Resp: 16  Temp: 98.5 F (36.9 C)  SpO2: 99%   Filed Weights   10/14/18 1540  Weight: 197 lb (89.4 kg)   Body mass index is 29.95 kg/m.  BP Readings from Last 3 Encounters:  10/14/18 134/90  07/08/16 118/80  11/20/15 134/69    Wt Readings from Last 3 Encounters:  10/14/18 197 lb (89.4 kg)  07/08/16 194 lb (88 kg)  11/19/15 190 lb (86.2 kg)     Physical Exam Constitutional: She appears well-developed and well-nourished. No distress.  HENT:  Head: Normocephalic and atraumatic.  Right Ear: External ear normal. Normal ear canal and TM Left Ear: External ear normal.  Normal ear canal and TM Mouth/Throat: Oropharynx is clear and moist.  Eyes: Conjunctivae and EOM are normal.  Neck: Neck supple. No tracheal deviation present. No thyromegaly present.  No carotid bruit  Cardiovascular: Normal rate, regular rhythm and normal heart sounds.   No murmur heard.  No edema. Pulmonary/Chest: Effort normal and breath sounds normal. No respiratory distress. She has no wheezes. She has no rales.  Breast: deferred Abdominal: Soft. She exhibits no distension. There is no tenderness. GU: Deferred Lymphadenopathy: She has no cervical adenopathy.  Skin: Skin is warm and dry. She is not diaphoretic.  Psychiatric: She has a normal mood and affect. Her behavior is normal.        Assessment & Plan:   Physical exam: Screening blood work  ordered Immunizations  Discussed  shingrix, tdap, flu Colonoscopy  Never had one -would like to do that this summer-referred Mammogram -due-we will schedule Gyn   due-we will schedule Eye exams  Up to date  EKG   Done 11/2015 Exercise none currently-stressed the importance of regular exercise Weight encouraged weight loss Skin   no skin concerns Substance abuse   none  See Problem List for Assessment and Plan of chronic medical problems.   Follow-up annually, sooner if needed

## 2018-10-13 NOTE — Patient Instructions (Addendum)
Tests ordered today. Your results will be released to [place] (or called to you) after review, usually within 72hours after test completion. If any changes need to be made, you will be notified at that same time.  All other Health Maintenance issues reviewed.   All recommended immunizations and age-appropriate screenings are up-to-date or discussed.  No immunizations administered today.   Medications reviewed and updated.  Changes include :   Cream for your hemorrhoids.    Your prescription(s) have been submitted to your pharmacy. Please take as directed and contact our office if you believe you are having problem(s) with the medication(s).   A referral was ordered for Gi and ENT  Please followup in one year     Health Maintenance, Female Adopting a healthy lifestyle and getting preventive care can go a long way to promote health and wellness. Talk with your health care provider about what schedule of regular examinations is right for you. This is a good chance for you to check in with your provider about disease prevention and staying healthy. In between checkups, there are plenty of things you can do on your own. Experts have done a lot of research about which lifestyle changes and preventive measures are most likely to keep you healthy. Ask your health care provider for more information. Weight and diet Eat a healthy diet  Be sure to include plenty of vegetables, fruits, low-fat dairy products, and lean protein.  Do not eat a lot of foods high in solid fats, added sugars, or salt.  Get regular exercise. This is one of the most important things you can do for your health. ? Most adults should exercise for at least 150 minutes each week. The exercise should increase your heart rate and make you sweat (moderate-intensity exercise). ? Most adults should also do strengthening exercises at least twice a week. This is in addition to the moderate-intensity exercise. Maintain a healthy  weight  Body mass index (BMI) is a measurement that can be used to identify possible weight problems. It estimates body fat based on height and weight. Your health care provider can help determine your BMI and help you achieve or maintain a healthy weight.  For females 65 years of age and older: ? A BMI below 18.5 is considered underweight. ? A BMI of 18.5 to 24.9 is normal. ? A BMI of 25 to 29.9 is considered overweight. ? A BMI of 30 and above is considered obese. Watch levels of cholesterol and blood lipids  You should start having your blood tested for lipids and cholesterol at 51 years of age, then have this test every 5 years.  You may need to have your cholesterol levels checked more often if: ? Your lipid or cholesterol levels are high. ? You are older than 51 years of age. ? You are at high risk for heart disease. Cancer screening Lung Cancer  Lung cancer screening is recommended for adults 70-34 years old who are at high risk for lung cancer because of a history of smoking.  A yearly low-dose CT scan of the lungs is recommended for people who: ? Currently smoke. ? Have quit within the past 15 years. ? Have at least a 30-pack-year history of smoking. A pack year is smoking an average of one pack of cigarettes a day for 1 year.  Yearly screening should continue until it has been 15 years since you quit.  Yearly screening should stop if you develop a health problem that would prevent  you from having lung cancer treatment. Breast Cancer  Practice breast self-awareness. This means understanding how your breasts normally appear and feel.  It also means doing regular breast self-exams. Let your health care provider know about any changes, no matter how small.  If you are in your 20s or 30s, you should have a clinical breast exam (CBE) by a health care provider every 1-3 years as part of a regular health exam.  If you are 53 or older, have a CBE every year. Also consider  having a breast X-ray (mammogram) every year.  If you have a family history of breast cancer, talk to your health care provider about genetic screening.  If you are at high risk for breast cancer, talk to your health care provider about having an MRI and a mammogram every year.  Breast cancer gene (BRCA) assessment is recommended for women who have family members with BRCA-related cancers. BRCA-related cancers include: ? Breast. ? Ovarian. ? Tubal. ? Peritoneal cancers.  Results of the assessment will determine the need for genetic counseling and BRCA1 and BRCA2 testing. Cervical Cancer Your health care provider may recommend that you be screened regularly for cancer of the pelvic organs (ovaries, uterus, and vagina). This screening involves a pelvic examination, including checking for microscopic changes to the surface of your cervix (Pap test). You may be encouraged to have this screening done every 3 years, beginning at age 29.  For women ages 38-65, health care providers may recommend pelvic exams and Pap testing every 3 years, or they may recommend the Pap and pelvic exam, combined with testing for human papilloma virus (HPV), every 5 years. Some types of HPV increase your risk of cervical cancer. Testing for HPV may also be done on women of any age with unclear Pap test results.  Other health care providers may not recommend any screening for nonpregnant women who are considered low risk for pelvic cancer and who do not have symptoms. Ask your health care provider if a screening pelvic exam is right for you.  If you have had past treatment for cervical cancer or a condition that could lead to cancer, you need Pap tests and screening for cancer for at least 20 years after your treatment. If Pap tests have been discontinued, your risk factors (such as having a new sexual partner) need to be reassessed to determine if screening should resume. Some women have medical problems that increase the  chance of getting cervical cancer. In these cases, your health care provider may recommend more frequent screening and Pap tests. Colorectal Cancer  This type of cancer can be detected and often prevented.  Routine colorectal cancer screening usually begins at 51 years of age and continues through 51 years of age.  Your health care provider may recommend screening at an earlier age if you have risk factors for colon cancer.  Your health care provider may also recommend using home test kits to check for hidden blood in the stool.  A small camera at the end of a tube can be used to examine your colon directly (sigmoidoscopy or colonoscopy). This is done to check for the earliest forms of colorectal cancer.  Routine screening usually begins at age 13.  Direct examination of the colon should be repeated every 5-10 years through 51 years of age. However, you may need to be screened more often if early forms of precancerous polyps or small growths are found. Skin Cancer  Check your skin from head to toe  regularly.  Tell your health care provider about any new moles or changes in moles, especially if there is a change in a mole's shape or color.  Also tell your health care provider if you have a mole that is larger than the size of a pencil eraser.  Always use sunscreen. Apply sunscreen liberally and repeatedly throughout the day.  Protect yourself by wearing long sleeves, pants, a wide-brimmed hat, and sunglasses whenever you are outside. Heart disease, diabetes, and high blood pressure  High blood pressure causes heart disease and increases the risk of stroke. High blood pressure is more likely to develop in: ? People who have blood pressure in the high end of the normal range (130-139/85-89 mm Hg). ? People who are overweight or obese. ? People who are African American.  If you are 50-20 years of age, have your blood pressure checked every 3-5 years. If you are 43 years of age or older,  have your blood pressure checked every year. You should have your blood pressure measured twice-once when you are at a hospital or clinic, and once when you are not at a hospital or clinic. Record the average of the two measurements. To check your blood pressure when you are not at a hospital or clinic, you can use: ? An automated blood pressure machine at a pharmacy. ? A home blood pressure monitor.  If you are between 107 years and 77 years old, ask your health care provider if you should take aspirin to prevent strokes.  Have regular diabetes screenings. This involves taking a blood sample to check your fasting blood sugar level. ? If you are at a normal weight and have a low risk for diabetes, have this test once every three years after 51 years of age. ? If you are overweight and have a high risk for diabetes, consider being tested at a younger age or more often. Preventing infection Hepatitis B  If you have a higher risk for hepatitis B, you should be screened for this virus. You are considered at high risk for hepatitis B if: ? You were born in a country where hepatitis B is common. Ask your health care provider which countries are considered high risk. ? Your parents were born in a high-risk country, and you have not been immunized against hepatitis B (hepatitis B vaccine). ? You have HIV or AIDS. ? You use needles to inject street drugs. ? You live with someone who has hepatitis B. ? You have had sex with someone who has hepatitis B. ? You get hemodialysis treatment. ? You take certain medicines for conditions, including cancer, organ transplantation, and autoimmune conditions. Hepatitis C  Blood testing is recommended for: ? Everyone born from [birth year] through 1963. ? Anyone with known risk factors for hepatitis C. Sexually transmitted infections (STIs)  You should be screened for sexually transmitted infections (STIs) including gonorrhea and chlamydia if: ? You are sexually active  and are younger than 51 years of age. ? You are older than 51 years of age and your health care provider tells you that you are at risk for this type of infection. ? Your sexual activity has changed since you were last screened and you are at an increased risk for chlamydia or gonorrhea. Ask your health care provider if you are at risk.  If you do not have HIV, but are at risk, it may be recommended that you take a prescription medicine daily to prevent HIV infection. This is called pre-exposure  prophylaxis (PrEP). You are considered at risk if: ? You are sexually active and do not regularly use condoms or know the HIV status of your partner(s). ? You take drugs by injection. ? You are sexually active with a partner who has HIV. Talk with your health care provider about whether you are at high risk of being infected with HIV. If you choose to begin PrEP, you should first be tested for HIV. You should then be tested every 3 months for as long as you are taking PrEP. Pregnancy  If you are premenopausal and you may become pregnant, ask your health care provider about preconception counseling.  If you may become pregnant, take 400 to 800 micrograms (mcg) of folic acid every day.  If you want to prevent pregnancy, talk to your health care provider about birth control (contraception). Osteoporosis and menopause  Osteoporosis is a disease in which the bones lose minerals and strength with aging. This can result in serious bone fractures. Your risk for osteoporosis can be identified using a bone density scan.  If you are 91 years of age or older, or if you are at risk for osteoporosis and fractures, ask your health care provider if you should be screened.  Ask your health care provider whether you should take a calcium or vitamin D supplement to lower your risk for osteoporosis.  Menopause may have certain physical symptoms and risks.  Hormone replacement therapy may reduce some of these symptoms  and risks. Talk to your health care provider about whether hormone replacement therapy is right for you. Follow these instructions at home:  Schedule regular health, dental, and eye exams.  Stay current with your immunizations.  Do not use any tobacco products including cigarettes, chewing tobacco, or electronic cigarettes.  If you are pregnant, do not drink alcohol.  If you are breastfeeding, limit how much and how often you drink alcohol.  Limit alcohol intake to no more than 1 drink per day for nonpregnant women. One drink equals 12 ounces of beer, 5 ounces of wine, or 1 ounces of hard liquor.  Do not use street drugs.  Do not share needles.  Ask your health care provider for help if you need support or information about quitting drugs.  Tell your health care provider if you often feel depressed.  Tell your health care provider if you have ever been abused or do not feel safe at home. This information is not intended to replace advice given to you by your health care provider. Make sure you discuss any questions you have with your health care provider. Document Released: 03/23/2011 Document Revised: 02/13/2016 Document Reviewed: 06/11/2015 Elsevier Interactive Patient Education  2019 [employer]. c

## 2018-10-14 ENCOUNTER — Encounter: Payer: Self-pay | Admitting: Internal Medicine

## 2018-10-14 ENCOUNTER — Ambulatory Visit (INDEPENDENT_AMBULATORY_CARE_PROVIDER_SITE_OTHER): Payer: BC Managed Care – PPO | Admitting: Internal Medicine

## 2018-10-14 ENCOUNTER — Other Ambulatory Visit (INDEPENDENT_AMBULATORY_CARE_PROVIDER_SITE_OTHER): Payer: BC Managed Care – PPO

## 2018-10-14 VITALS — BP 134/90 | HR 67 | Temp 98.5°F | Resp 16 | Ht 68.0 in | Wt 197.0 lb

## 2018-10-14 DIAGNOSIS — Z0000 Encounter for general adult medical examination without abnormal findings: Secondary | ICD-10-CM

## 2018-10-14 DIAGNOSIS — K644 Residual hemorrhoidal skin tags: Secondary | ICD-10-CM | POA: Insufficient documentation

## 2018-10-14 DIAGNOSIS — L659 Nonscarring hair loss, unspecified: Secondary | ICD-10-CM | POA: Diagnosis not present

## 2018-10-14 DIAGNOSIS — Z833 Family history of diabetes mellitus: Secondary | ICD-10-CM

## 2018-10-14 DIAGNOSIS — R0981 Nasal congestion: Secondary | ICD-10-CM

## 2018-10-14 DIAGNOSIS — R42 Dizziness and giddiness: Secondary | ICD-10-CM

## 2018-10-14 DIAGNOSIS — Z1211 Encounter for screening for malignant neoplasm of colon: Secondary | ICD-10-CM

## 2018-10-14 LAB — COMPREHENSIVE METABOLIC PANEL
ALBUMIN: 4.9 g/dL (ref 3.5–5.2)
ALK PHOS: 68 U/L (ref 39–117)
ALT: 15 U/L (ref 0–35)
AST: 15 U/L (ref 0–37)
BUN: 10 mg/dL (ref 6–23)
CHLORIDE: 100 meq/L (ref 96–112)
CO2: 24 mEq/L (ref 19–32)
CREATININE: 0.78 mg/dL (ref 0.40–1.20)
Calcium: 10.3 mg/dL (ref 8.4–10.5)
GFR: 94.5 mL/min (ref >60.00)
GLUCOSE: 97 mg/dL (ref 70–99)
POTASSIUM: 3.9 meq/L (ref 3.5–5.1)
SODIUM: 134 meq/L — AB (ref 135–145)
TOTAL PROTEIN: 8.2 g/dL (ref 6.0–8.3)
Total Bilirubin: 0.5 mg/dL (ref 0.2–1.2)

## 2018-10-14 LAB — CBC WITH DIFFERENTIAL/PLATELET
BASOS PCT: 1.2 % (ref 0.0–3.0)
Basophils Absolute: 0.1 10*3/uL (ref 0.0–0.1)
EOS ABS: 0.1 10*3/uL (ref 0.0–0.7)
EOS PCT: 1.2 % (ref 0.0–5.0)
HCT: 42.9 % (ref 36.0–46.0)
Hemoglobin: 14.5 g/dL (ref 12.0–15.0)
LYMPHS ABS: 1.9 10*3/uL (ref 0.7–4.0)
Lymphocytes Relative: 34.6 % (ref 12.0–46.0)
MCHC: 33.9 g/dL (ref 30.0–36.0)
MCV: 91.6 fl (ref 78.0–100.0)
Monocytes Absolute: 0.5 10*3/uL (ref 0.1–1.0)
Monocytes Relative: 8.1 % (ref 3.0–12.0)
NEUTROS PCT: 54.9 % (ref 43.0–77.0)
Neutro Abs: 3.1 10*3/uL (ref 1.4–7.7)
PLATELETS: 358 10*3/uL (ref 150.0–400.0)
RBC: 4.68 Mil/uL (ref 3.87–5.11)
RDW: 13 % (ref 11.5–15.5)
WBC: 5.6 10*3/uL (ref 4.0–10.5)

## 2018-10-14 LAB — HEMOGLOBIN A1C: Hgb A1c MFr Bld: 5.9 % (ref 4.6–6.5)

## 2018-10-14 LAB — LIPID PANEL
CHOLESTEROL: 240 mg/dL — AB (ref 0–200)
HDL: 57.9 mg/dL (ref >39.00)
LDL Cholesterol: 166 mg/dL — ABNORMAL HIGH (ref 0–99)
NonHDL: 181.97
TRIGLYCERIDES: 78 mg/dL (ref 0.0–149.0)
Total CHOL/HDL Ratio: 4
VLDL: 15.6 mg/dL (ref 0.0–40.0)

## 2018-10-14 MED ORDER — HYDROCORTISONE 2.5 % RE CREA: 1.0000 "application " | TOPICAL_CREAM | Freq: Two times a day (BID) | RECTAL | 5 refills | Status: AC

## 2018-10-15 DIAGNOSIS — R0981 Nasal congestion: Secondary | ICD-10-CM | POA: Insufficient documentation

## 2018-10-15 NOTE — Assessment & Plan Note (Signed)
Some hair loss top of head Will check B12, TSH Possibly related to menopause or getting older Can refer to Derm if needed

## 2018-10-15 NOTE — Assessment & Plan Note (Signed)
Present for 9 years Currently flared up She does not want to consider procedure at this time Hydrocortisone rectal cream Warm sits baths Will be referred for a colonoscopy and can discuss further with GI-she may benefit from banding

## 2018-10-15 NOTE — Assessment & Plan Note (Signed)
Chronic ?  Allergy related Discussed taking allergy medications consistently Also discussed removing carpets from her house and other ways of avoiding allergens Will refer to ENT for that and dizziness May need to see allergy

## 2018-10-15 NOTE — Assessment & Plan Note (Signed)
Check A1c Stressed regular exercise and low sugar/carbohydrate diet

## 2018-10-15 NOTE — Assessment & Plan Note (Signed)
Having intermittent dizziness, which is not new Dizziness is transient and occurs throughout the day-more during the workweek and on the weekends She has bifocals and that is likely contributing-we will discuss again with her ophthalmologist We will refer to ENT to rule out the possibility of BPPV Increase fluids during the day to see if that helps Unlikely related to hypoglycemia

## 2018-10-17 ENCOUNTER — Encounter: Payer: Self-pay | Admitting: Internal Medicine

## 2018-10-17 DIAGNOSIS — R7303 Prediabetes: Secondary | ICD-10-CM | POA: Insufficient documentation

## 2018-10-17 DIAGNOSIS — E785 Hyperlipidemia, unspecified: Secondary | ICD-10-CM | POA: Insufficient documentation

## 2018-10-17 LAB — VITAMIN B12: VITAMIN B 12: 556 pg/mL (ref 211–911)

## 2018-10-17 LAB — TSH: TSH: 1.86 u[IU]/mL (ref 0.35–4.50)

## 2018-10-28 ENCOUNTER — Encounter: Payer: BC Managed Care – PPO | Admitting: Internal Medicine

## 2018-12-29 ENCOUNTER — Encounter: Payer: Self-pay | Admitting: Internal Medicine

## 2021-08-29 ENCOUNTER — Emergency Department (HOSPITAL_BASED_OUTPATIENT_CLINIC_OR_DEPARTMENT_OTHER): Payer: BC Managed Care – PPO

## 2021-08-29 ENCOUNTER — Other Ambulatory Visit: Payer: Self-pay

## 2021-08-29 ENCOUNTER — Encounter (HOSPITAL_BASED_OUTPATIENT_CLINIC_OR_DEPARTMENT_OTHER): Payer: Self-pay

## 2021-08-29 ENCOUNTER — Emergency Department (HOSPITAL_BASED_OUTPATIENT_CLINIC_OR_DEPARTMENT_OTHER)
Admission: EM | Admit: 2021-08-29 | Discharge: 2021-08-29 | Disposition: A | Discharge status: Home / Self Care | Payer: BC Managed Care – PPO | Attending: Student | Admitting: Student

## 2021-08-29 DIAGNOSIS — M79652 Pain in left thigh: Secondary | ICD-10-CM | POA: Diagnosis not present

## 2021-08-29 DIAGNOSIS — R7989 Other specified abnormal findings of blood chemistry: Secondary | ICD-10-CM | POA: Diagnosis present

## 2021-08-29 DIAGNOSIS — R7303 Prediabetes: Secondary | ICD-10-CM | POA: Diagnosis not present

## 2021-08-29 MED ORDER — IOHEXOL 350 MG/ML SOLN
75.0000 mL | Freq: Once | INTRAVENOUS | Status: AC | PRN
  Administered 2021-08-29: 75 mL via INTRAVENOUS

## 2021-08-29 NOTE — ED Notes (Signed)
First contact with patient. Patient arrived via triage from home with complaints of feeling shaky and dizzy. Patient had blood work drawn and was informed to come to ED for elevated d-dimer. Patient denies symptoms at this time. Patient is A&OX 4. Respirations even/unlabored. Denies shortness of breath or dyspnea. Patient changed into gown and placed on cardiac monitor and call light within reach. Patient updated on plan of care. Will continue to monitor patient.

## 2021-08-29 NOTE — Discharge Instructions (Signed)
You are seen in the emergency department for evaluation of an isolated elevated D-dimer.  Scans performed in the emergency department were reassuringly negative for blood clots in your legs or lungs.  At this time you are safe for discharge.  Please return the emergency department if you have new or worsening shortness of breath, chest pain, fever or any other concerning symptoms.

## 2021-08-29 NOTE — ED Provider Notes (Signed)
[place] EMERGENCY DEPARTMENT Provider Note   CSN: [id number] Arrival date & time: 08/29/21  [id number]     History Chief Complaint  Patient presents with   elevated D-Dimer    Jean Dominguez is a 55 y.o. female who presents emergency department for evaluation of an elevated D-dimer obtained at urgent care.  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  Urgent care did a large laboratory panel and then sent the patient home prior to these labs resulting.  They then called the patient today and told the patient to come to the emergency department as her D-dimer was elevated.  Patient endorses mild left thigh pain that has been achy for a few weeks.  Denies smoking history or oral contraceptive use.  Denies long travel history.  Denies shortness of breath or pleuritic chest pain.  She does endorse mild indigestion-like symptoms today but arrives with a normal heart rate and satting 98% on room air.  Denies abdominal pain, diarrhea, cough, headache, fever or other systemic symptoms.  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   IUD contraception 08/16/2012    Past Surgical History:  Procedure Laterality Date   DILATION AND CURETTAGE OF UTERUS     WISDOM TOOTH EXTRACTION       OB History     Gravida  3   Para  2   Term      Preterm      AB      Living  2       SAB      IAB      Ectopic      Multiple      Live Births              Family History  Problem Relation Age of Onset   Heart disease Mother        MI   Hypertension Mother    Hypertension Father    Thyroid disease Sister     Social History   Tobacco Use   Smoking status: Never   Smokeless tobacco: Never  Substance Use Topics   Alcohol use: Yes   Drug use: No    Home Medications Prior to Admission medications   Medication Sig Start Date End Date Taking? Authorizing Provider  Cholecalciferol (VITAMIN D3) 2000 units capsule Take 1 capsule by mouth daily.    [provider]  hydrocortisone (ANUSOL-HC) 2.5 % rectal cream Place 1 application rectally 2 (two) times daily. 10/14/18   Binnie Rail, MD    Allergies    Macrobid [nitrofurantoin macrocrystal] and Shellfish allergy  Review of Systems   Review of Systems  Constitutional:  Negative for chills and fever.  HENT:  Negative for ear pain and sore throat.   Eyes:  Negative for pain and visual disturbance.  Respiratory:  Negative for cough and shortness  of breath.   Cardiovascular:  Negative for chest pain and palpitations.  Gastrointestinal:  Negative for abdominal pain and vomiting.  Genitourinary:  Negative for dysuria and hematuria.  Musculoskeletal:  Positive for myalgias. Negative for arthralgias and back pain.  Skin:  Negative for color change and rash.  Neurological:  Negative for seizures and syncope.  All other systems reviewed and are negative.  Physical Exam Updated Vital Signs BP 122/69   Pulse 66   Temp 98 F (36.7 C) (Oral)   Resp 20   Ht 5\' 8"  (1.727 m)   Wt 86.2 kg   LMP  (LMP Unknown)   SpO2 100%   BMI 28.89 kg/m   Physical Exam Vitals and nursing note reviewed.  Constitutional:      General: She is not in acute distress.    Appearance: She is well-developed.  HENT:     Head: Normocephalic and atraumatic.  Eyes:     Conjunctiva/sclera: Conjunctivae normal.   Cardiovascular:     Rate and Rhythm: Normal rate and regular rhythm.     Heart sounds: No murmur heard. Pulmonary:     Effort: Pulmonary effort is normal. No respiratory distress.     Breath sounds: Normal breath sounds.  Abdominal:     Palpations: Abdomen is soft.     Tenderness: There is no abdominal tenderness.  Musculoskeletal:        General: Tenderness (Lateral thigh, left) present. No swelling.     Cervical back: Neck supple.  Skin:    General: Skin is warm and dry.     Capillary Refill: Capillary refill takes less than 2 seconds.  Neurological:     Mental Status: She is alert.  Psychiatric:        Mood and Affect: Mood normal.    ED Results / Procedures / Treatments   Labs (all labs ordered are listed, but only abnormal results are displayed) Labs Reviewed - No data to display  EKG None  Radiology CT Angio Chest PE W and/or Wo Contrast  Result Date: 08/29/2021 CLINICAL DATA:  Positive D-dimer. Patient reports feeling shaky and dizzy. EXAM: CT ANGIOGRAPHY CHEST WITH CONTRAST TECHNIQUE: Multidetector CT imaging of the chest was performed using the standard protocol during bolus administration of intravenous contrast. Multiplanar CT image reconstructions and MIPs were obtained to evaluate the vascular anatomy. CONTRAST:  55mL OMNIPAQUE IOHEXOL 350 MG/ML SOLN COMPARISON:  None. FINDINGS: Cardiovascular: Satisfactory opacification of the pulmonary arteries to the segmental level. No evidence of pulmonary embolism. Normal heart size. No pericardial effusion. Mediastinum/Nodes: No enlarged mediastinal, hilar, or axillary lymph nodes. Thyroid gland, trachea, and esophagus demonstrate no significant findings. Lungs/Pleura: No pleural effusion, airspace consolidation, or pneumothorax. No suspicious lung nodule identified. Upper Abdomen: No acute abnormality. Musculoskeletal: No chest wall abnormality. No acute or significant osseous findings. Review of the MIP images confirms the  above findings. IMPRESSION: 1. No acute cardiopulmonary abnormalities. No evidence for acute pulmonary embolus. Electronically Signed   By: Kerby Moors M.D.   On: 08/29/2021 11:28   US Venous Img Lower  Left (DVT Study)  Result Date: 08/29/2021 CLINICAL DATA:  Left leg pain, elevated D-dimer EXAM: LEFT LOWER EXTREMITY VENOUS DOPPLER ULTRASOUND TECHNIQUE: Gray-scale sonography with graded compression, as well as color Doppler and duplex ultrasound were performed to evaluate the lower extremity deep venous systems from the level of the common femoral vein and including the common femoral, femoral, profunda femoral, popliteal and calf veins including the posterior tibial, peroneal and gastrocnemius veins  when visible. The superficial great saphenous vein was also interrogated. Spectral Doppler was utilized to evaluate flow at rest and with distal augmentation maneuvers in the common femoral, femoral and popliteal veins. COMPARISON:  None. FINDINGS: Contralateral Common Femoral Vein: Respiratory phasicity is normal and symmetric with the symptomatic side. No evidence of thrombus. Normal compressibility. Common Femoral Vein: No evidence of thrombus. Normal compressibility, respiratory phasicity and response to augmentation. Saphenofemoral Junction: No evidence of thrombus. Normal compressibility and flow on color Doppler imaging. Profunda Femoral Vein: No evidence of thrombus. Normal compressibility and flow on color Doppler imaging. Femoral Vein: No evidence of thrombus. Normal compressibility, respiratory phasicity and response to augmentation. Popliteal Vein: No evidence of thrombus. Normal compressibility, respiratory phasicity and response to augmentation. Calf Veins: No evidence of thrombus. Normal compressibility and flow on color Doppler imaging. IMPRESSION: No evidence of deep venous thrombosis. Electronically Signed   By: Judie Petit.  Shick M.D.   On: 08/29/2021 10:57    Procedures Procedures   Medications  Ordered in ED Medications  iohexol (OMNIPAQUE) 350 MG/ML injection 75 mL (75 mLs Intravenous Contrast Given 08/29/21 1103)    ED Course  I have reviewed the triage vital signs and the nursing notes.  Pertinent labs & imaging results that were available during my care of the patient were reviewed by me and considered in my medical decision making (see chart for details).    MDM Rules/Calculators/A&P                           Patient seen the emergency department for evaluation of an elevated D-dimer.  Physical exam is unremarkable.  She does have mild tenderness over the left lateral thigh and thus in order to fully evaluate her elevated D-dimer, a CT PE and Doppler ultrasound of left lower extremity were performed that were both reassuringly negative.  Patient is asymptomatic on reevaluation and is safe for discharge.  Patient to discharge. Final Clinical Impression(s) / ED Diagnoses Final diagnoses:  Positive D dimer    Rx / DC Orders ED Discharge Orders     None        Hemi Chacko, MD 08/29/21 1142

## 2022-08-17 IMAGING — CT CT ANGIO CHEST
2 of 8 series · 19 of 36 positions shown · IV contrast (omnipaque)
Comparison: None.

CLINICAL DATA: Positive D-dimer. Patient reports feeling shaky and
dizzy.

EXAM:
CT ANGIOGRAPHY CHEST WITH CONTRAST
TECHNIQUE: Multidetector CT imaging of the chest was performed using the
standard protocol during bolus administration of intravenous
contrast. Multiplanar CT image reconstructions and MIPs were
obtained to evaluate the vascular anatomy.
CONTRAST:  75mL OMNIPAQUE IOHEXOL 350 MG/ML SOLN

[Series 7: pe coronal mpr · coronal · 0.54mm/px · 1 of 128 slices shown]
[im 64/128  mediastinal]
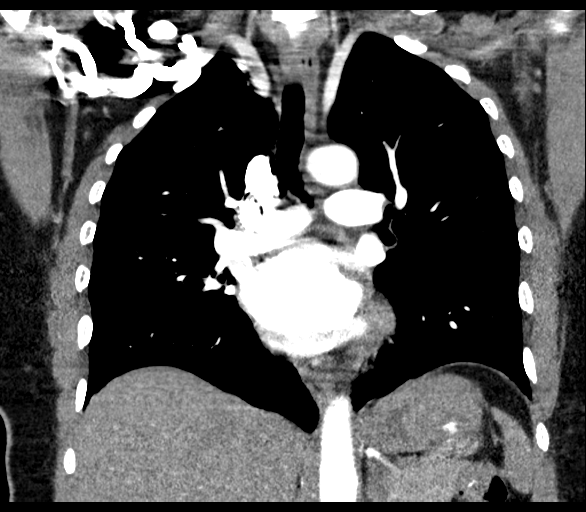

[Series 11: pe thins · axial · 0.70mm/px · z∈[-340,-102]mm · 18 of 266 slices shown]
[im 14/266  lung]
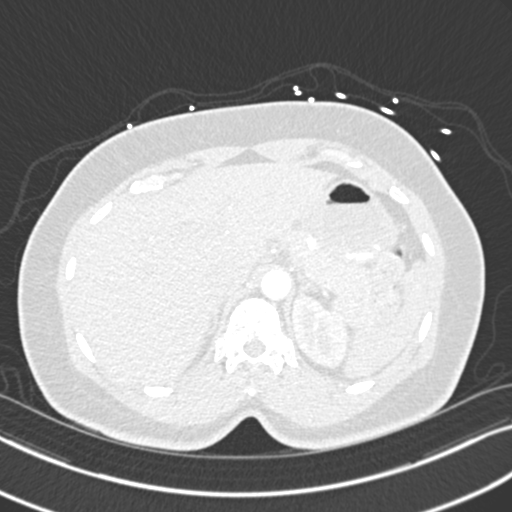
[im 28/266  mediastinal]
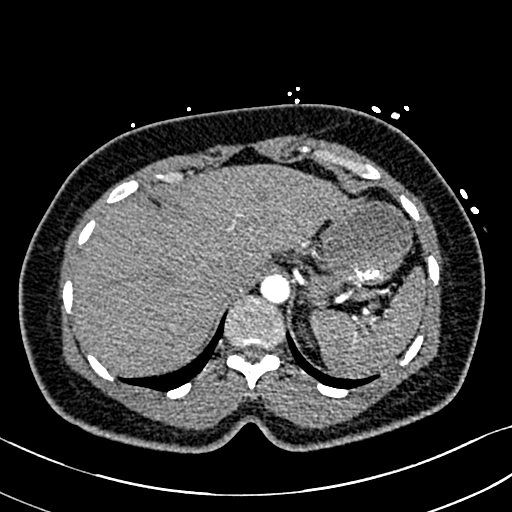
[im 42/266  lung]
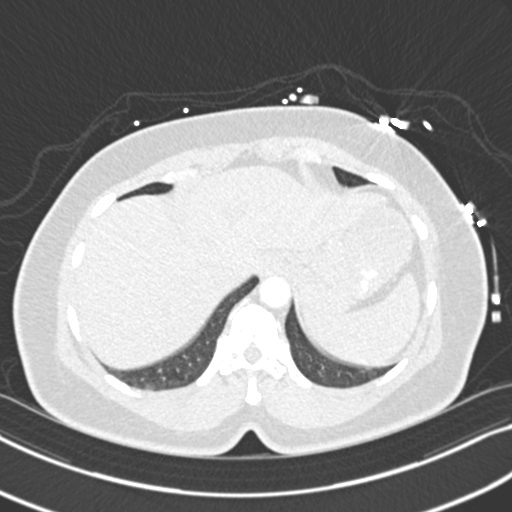
[im 56/266  mediastinal]
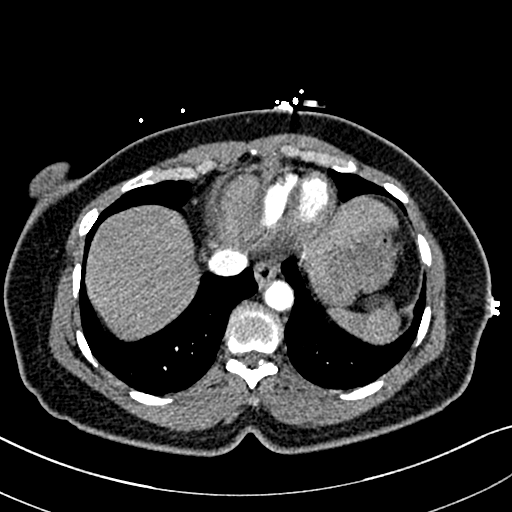
[im 70/266  lung]
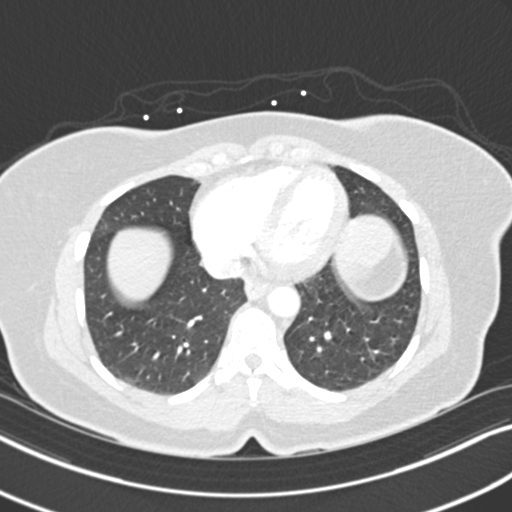
[im 84/266  mediastinal]
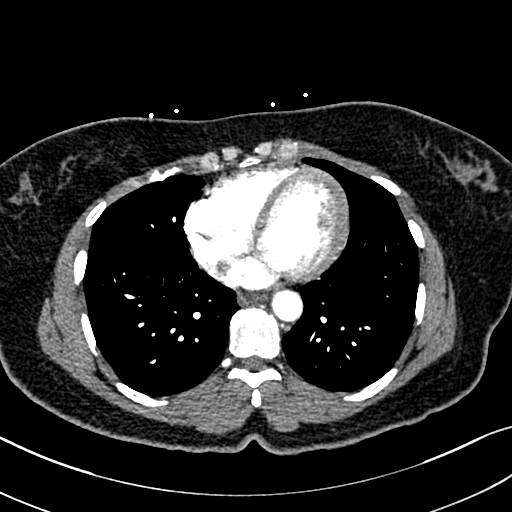
[im 98/266  lung]
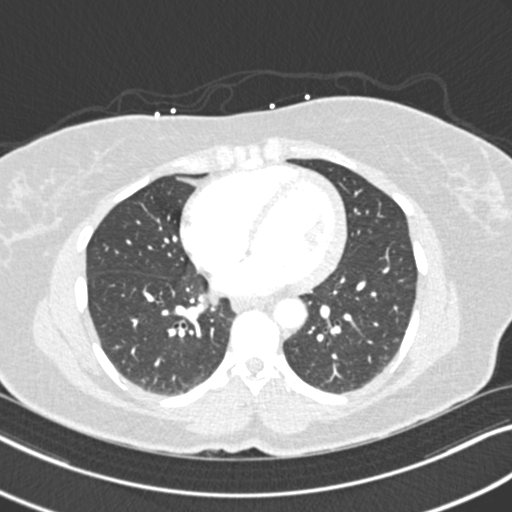
[im 112/266  mediastinal]
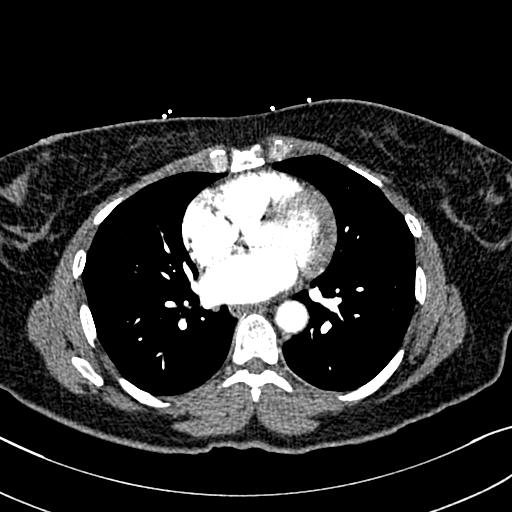
[im 126/266  lung]
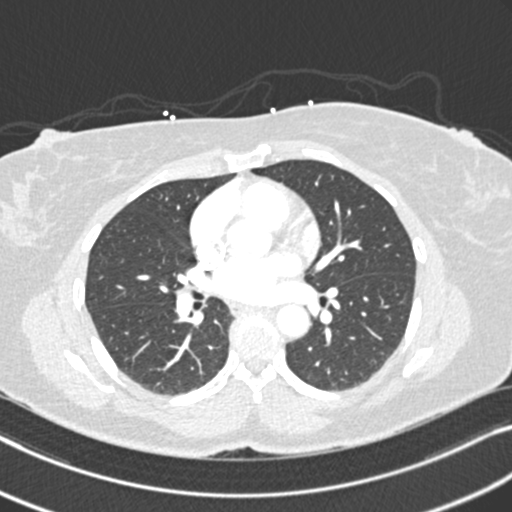
[im 140/266  mediastinal]
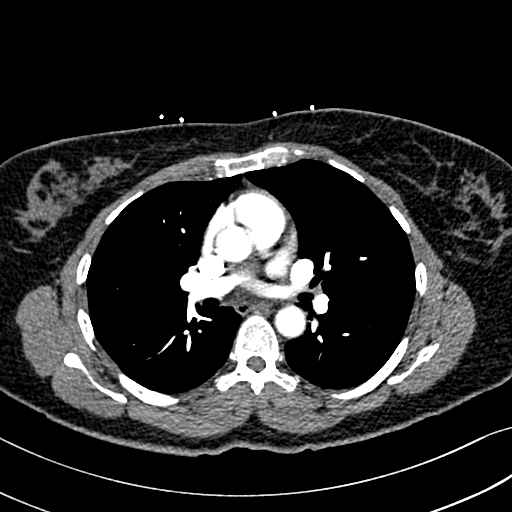
[im 154/266  lung]
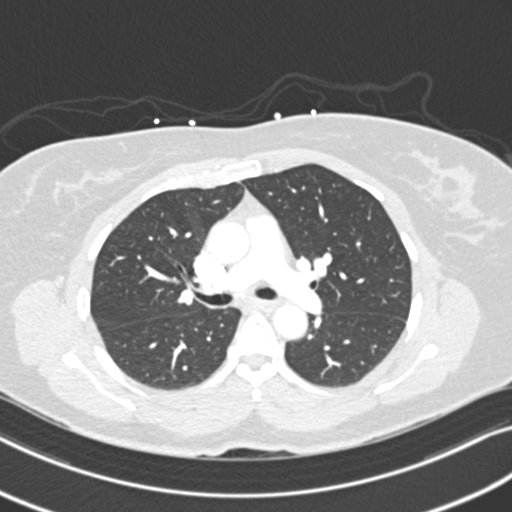
[im 168/266  mediastinal]
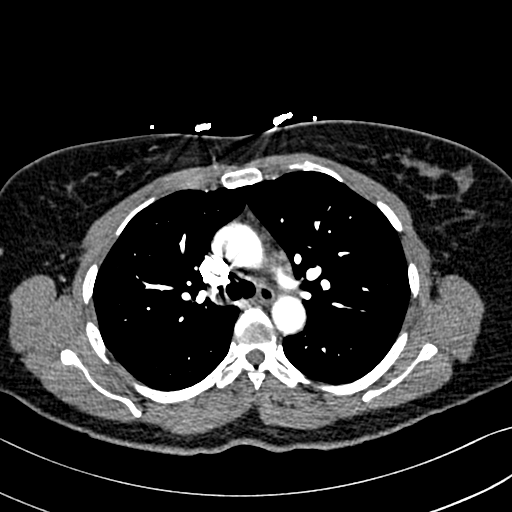
[im 182/266  lung]
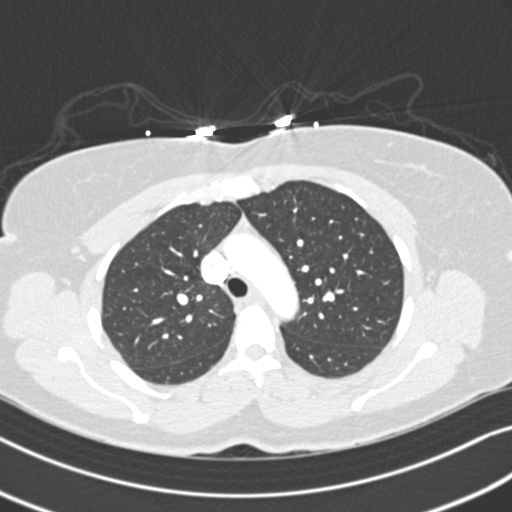
[im 196/266  mediastinal]
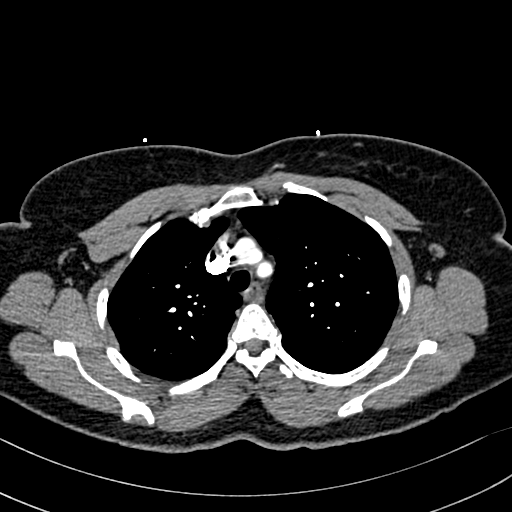
[im 210/266  lung]
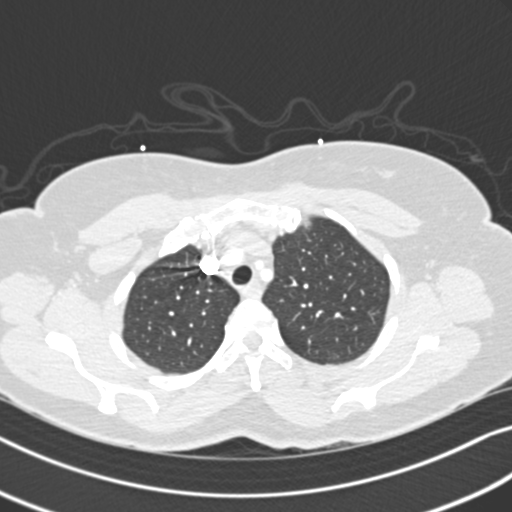
[im 224/266  mediastinal]
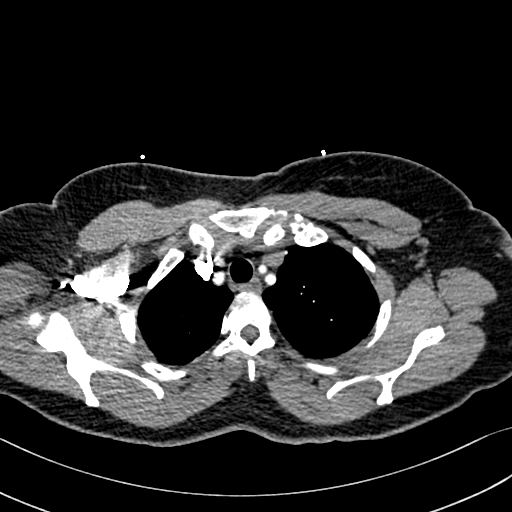
[im 238/266  lung]
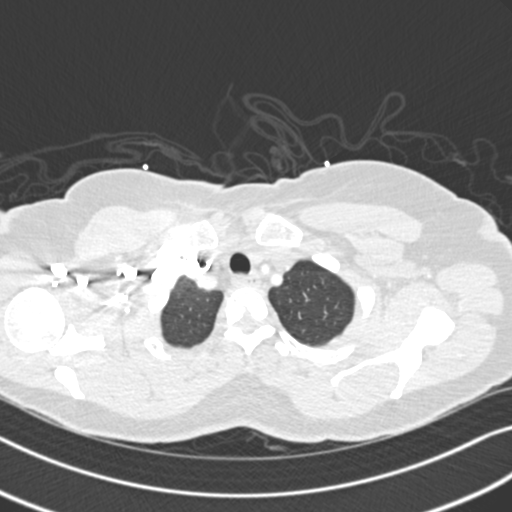
[im 252/266  mediastinal]
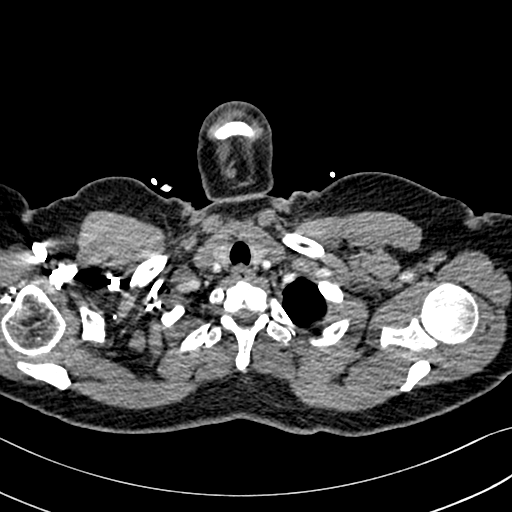

[19 of 36 positions shown; findings below may reference images not displayed]

FINDINGS: Cardiovascular: Satisfactory opacification of the pulmonary arteries
to the segmental level. No evidence of pulmonary embolism. Normal
heart size. No pericardial effusion.

Mediastinum/Nodes: No enlarged mediastinal, hilar, or axillary lymph
nodes. Thyroid gland, trachea, and esophagus demonstrate no
significant findings.

Lungs/Pleura: No pleural effusion, airspace consolidation, or
pneumothorax. No suspicious lung nodule identified.

Upper Abdomen: No acute abnormality.

Musculoskeletal: No chest wall abnormality. No acute or significant
osseous findings.

Review of the MIP images confirms the above findings.
IMPRESSION: 1. No acute cardiopulmonary abnormalities. No evidence for acute
pulmonary embolus.

## 2023-05-12 ENCOUNTER — Encounter (HOSPITAL_BASED_OUTPATIENT_CLINIC_OR_DEPARTMENT_OTHER): Payer: Self-pay | Admitting: Emergency Medicine

## 2023-05-12 ENCOUNTER — Emergency Department (HOSPITAL_BASED_OUTPATIENT_CLINIC_OR_DEPARTMENT_OTHER)
Admission: EM | Admit: 2023-05-12 | Discharge: 2023-05-12 | Disposition: A | Discharge status: Home / Self Care | Payer: BC Managed Care – PPO | Attending: Emergency Medicine | Admitting: Emergency Medicine

## 2023-05-12 ENCOUNTER — Other Ambulatory Visit: Payer: Self-pay

## 2023-05-12 ENCOUNTER — Emergency Department (HOSPITAL_BASED_OUTPATIENT_CLINIC_OR_DEPARTMENT_OTHER): Payer: BC Managed Care – PPO

## 2023-05-12 DIAGNOSIS — R531 Weakness: Secondary | ICD-10-CM | POA: Insufficient documentation

## 2023-05-12 DIAGNOSIS — G43009 Migraine without aura, not intractable, without status migrainosus: Secondary | ICD-10-CM

## 2023-05-12 DIAGNOSIS — G43909 Migraine, unspecified, not intractable, without status migrainosus: Secondary | ICD-10-CM | POA: Diagnosis not present

## 2023-05-12 LAB — CBC WITH DIFFERENTIAL/PLATELET
Abs Immature Granulocytes: 0.01 10*3/uL (ref 0.00–0.07)
Basophils Absolute: 0.1 10*3/uL (ref 0.0–0.1)
Basophils Relative: 1 %
Eosinophils Absolute: 0.2 10*3/uL (ref 0.0–0.5)
Eosinophils Relative: 3 %
HCT: 36.9 % (ref 36.0–46.0)
Hemoglobin: 12.7 g/dL (ref 12.0–15.0)
Immature Granulocytes: 0 %
Lymphocytes Relative: 47 %
Lymphs Abs: 2.4 10*3/uL (ref 0.7–4.0)
MCH: 31.2 pg (ref 26.0–34.0)
MCHC: 34.4 g/dL (ref 30.0–36.0)
MCV: 90.7 fL (ref 80.0–100.0)
Monocytes Absolute: 0.5 10*3/uL (ref 0.1–1.0)
Monocytes Relative: 10 %
Neutro Abs: 1.9 10*3/uL (ref 1.7–7.7)
Neutrophils Relative %: 39 %
Platelets: 312 10*3/uL (ref 150–400)
RBC: 4.07 MIL/uL (ref 3.87–5.11)
RDW: 12.8 % (ref 11.5–15.5)
WBC: 5 10*3/uL (ref 4.0–10.5)
nRBC: 0 % (ref 0.0–0.2)

## 2023-05-12 LAB — TROPONIN I (HIGH SENSITIVITY)
Troponin I (High Sensitivity): 2 ng/L (ref <18)
Troponin I (High Sensitivity): 3 ng/L (ref <18)

## 2023-05-12 LAB — COMPREHENSIVE METABOLIC PANEL
ALT: 21 U/L (ref 0–44)
AST: 26 U/L (ref 15–41)
Albumin: 4.3 g/dL (ref 3.5–5.0)
Alkaline Phosphatase: 71 U/L (ref 38–126)
Anion gap: 8 (ref 5–15)
BUN: 18 mg/dL (ref 6–20)
CO2: 25 mmol/L (ref 22–32)
Calcium: 9.4 mg/dL (ref 8.9–10.3)
Chloride: 102 mmol/L (ref 98–111)
Creatinine, Ser: 0.79 mg/dL (ref 0.44–1.00)
GFR, Estimated: >60 mL/min (ref >60)
Glucose, Bld: 121 mg/dL — ABNORMAL HIGH (ref 70–99)
Potassium: 4.6 mmol/L (ref 3.5–5.1)
Sodium: 135 mmol/L (ref 135–145)
Total Bilirubin: 0.5 mg/dL (ref 0.3–1.2)
Total Protein: 7.9 g/dL (ref 6.5–8.1)

## 2023-05-12 NOTE — ED Triage Notes (Addendum)
Pt reports she had a heavy feeling "like something was dragging me down" in LUE and LT side face; this happened at 1800 and shortly after, only lasting seconds each time; also reports RT chest and arm tightness

## 2023-05-12 NOTE — ED Provider Notes (Signed)
[place] [facility]ERGENCY DEPARTMENT AT [facility] Provider Note   CSN: [id number] Arrival date & time: 05/12/23  1906     History  Chief Complaint  Patient presents with   Weakness    Bernedette Insinga is a 57 y.o. female.  Ms Ashantie Sewer is a 57 yo F with PMH migraines, vertigo, HLD who presented to [facility] ED with complaint of brief L face and L UE pressure this evening. She was at home this evening when she experienced two episodes of L sided "pressure" as if someone were pulling her to the ground. The sensations were centered at her L face and L UE. This has never happened before. She did not have associated headache, vision changes, dizziness, syncope, or dysarthria. She felt normal before and leading up to this event without URI symptoms, N/V/D, or sick contacts. She denies recent fevers, chills, migraines, vertigo attacks, chest pain, palpitations, syncope.   Weakness Associated symptoms: no abdominal pain, no arthralgias, no chest pain, no cough, no diarrhea, no dizziness, no dysuria, no fever, no headaches, no myalgias, no nausea, no seizures, no shortness of breath and no vomiting        Home Medications Prior to Admission medications   Medication Sig Start Date End Date Taking? Authorizing Provider  Cholecalciferol (VITAMIN D3) 2000 units capsule Take 1 capsule by mouth daily.    [provider]  hydrocortisone (ANUSOL-HC) 2.5 % rectal cream Place 1 application rectally 2 (two) times daily. 10/14/18   Pincus Sanes, MD      Allergies    Macrobid [nitrofurantoin macrocrystal] and Shellfish allergy    Review of Systems   Review of Systems  Constitutional:  Negative for chills, fatigue and fever.  HENT:  Positive for congestion.   Eyes:  Negative for photophobia, pain and visual disturbance.  Respiratory:  Negative for cough, shortness of breath and wheezing.   Cardiovascular:  Negative for chest pain and palpitations.  Gastrointestinal:  Negative  for abdominal pain, constipation, diarrhea, nausea and vomiting.  Endocrine: Negative for polyuria.  Genitourinary:  Negative for dysuria.  Musculoskeletal:  Negative for arthralgias and myalgias.  Skin:  Negative for rash.  Neurological:  Positive for weakness. Negative for dizziness, seizures, syncope, numbness and headaches.  Psychiatric/Behavioral:  Negative for confusion. The patient is not nervous/anxious.     Physical Exam Updated Vital Signs BP (!) 158/94   Pulse 70   Temp 97.7 F (36.5 C) (Oral)   Resp 16   Ht 5\' 8"  (1.727 m)   Wt 90.7 kg   SpO2 100%   BMI 30.41 kg/m  Physical Exam Constitutional:      General: She is not in acute distress.    Appearance: Normal appearance. She is not ill-appearing.  HENT:     Head: Normocephalic and atraumatic.     Mouth/Throat:     Mouth: Mucous membranes are dry.  Eyes:     Extraocular Movements: Extraocular movements intact.  Cardiovascular:     Rate and Rhythm: Normal rate and regular rhythm.     Pulses: Normal pulses.     Heart sounds: Normal heart sounds.  Pulmonary:     Effort: Pulmonary effort is normal.     Breath sounds: Normal breath sounds.  Abdominal:     General: Abdomen is flat. Bowel sounds are normal.     Palpations: Abdomen is soft.     Tenderness: There is no abdominal tenderness.  Skin:    General: Skin is warm and  dry.  Neurological:     General: No focal deficit present.     Mental Status: She is alert and oriented to person, place, and time.     Cranial Nerves: No cranial nerve deficit.     Sensory: No sensory deficit.     Motor: No weakness.     Coordination: Coordination normal.  Psychiatric:        Mood and Affect: Mood normal.        Behavior: Behavior normal.     ED Results / Procedures / Treatments   Labs (all labs ordered are listed, but only abnormal results are displayed) Labs Reviewed  COMPREHENSIVE METABOLIC PANEL - Abnormal; Notable for the following components:      Result  Value   Glucose, Bld 121 (*)    All other components within normal limits  CBC WITH DIFFERENTIAL/PLATELET  TROPONIN I (HIGH SENSITIVITY)  TROPONIN I (HIGH SENSITIVITY)    EKG None  Radiology CT Head Wo Contrast  Result Date: 05/12/2023 CLINICAL DATA:  Altered mental status. EXAM: CT HEAD WITHOUT CONTRAST TECHNIQUE: Contiguous axial images were obtained from the base of the skull through the vertex without intravenous contrast. RADIATION DOSE REDUCTION: This exam was performed according to the departmental dose-optimization program which includes automated exposure control, adjustment of the mA and/or kV according to patient size and/or use of iterative reconstruction technique. COMPARISON:  None Available. FINDINGS: Brain: No evidence of acute infarction, hemorrhage, hydrocephalus, extra-axial collection or mass lesion/mass effect. Vascular: No hyperdense vessel or unexpected calcification. Skull: Normal. Negative for fracture or focal lesion. Sinuses/Orbits: No acute finding. Other: None. IMPRESSION: No acute intracranial abnormality. Electronically Signed   By: Aram Candela M.D.   On: 05/12/2023 20:08   DG Chest Portable 1 View  Result Date: 05/12/2023 CLINICAL DATA:  Left-sided chest pain. EXAM: PORTABLE CHEST 1 VIEW COMPARISON:  December 04, 2010 FINDINGS: The heart size and mediastinal contours are within normal limits. Both lungs are clear. The visualized skeletal structures are unremarkable. IMPRESSION: No active disease. Electronically Signed   By: Aram Candela M.D.   On: 05/12/2023 20:07    Procedures Procedures    Medications Ordered in ED Medications - No data to display  ED Course/ Medical Decision Making/ A&P Clinical Course as of 05/12/23 2312  Wed May 12, 2023  2032 Stable  52 YOF with a chief complaint of pressure on left face and shoulder episode. 5PM tonight.  AP Nonspecific MSK- RO ACS/Metabolic/Infection Vs  TIA Vs  Atypical Migraine Episode. [CC]   2037 ABCD2 of 3 [CC]  2210 I evaluated Ms. Puls at bedside. Extensive history.  Very atypical episode earlier today of left-sided numbness of her arm and face with potential weakness that she describes it very atypically. Symptoms resolved within 5 minutes.  Observed in emergency department for 3 hours with no further exacerbations of the symptoms.  Differential is broad ACS PE pneumothorax pneumonia all considered.  Objective evaluation performed evaluate for these conditions grossly nondiagnostic.  CT head without focal pathology.  Unlikely to be a TIA based on the localization being inconsistent with neurologic (ipsilateral face and arm without leg involvement including both sensory and motor function does not localize to clear neurologic etiology.) Long conversation with patient.  She has a history of migraines that similar in the past and she follows with neurology.  She is requesting a second opinion from neurology in the outpatient setting.  Will refer to [facility] in the outpatient setting for further care  and management.  [CC]    Clinical Course User Index [CC] Glyn Ade, MD                                 Medical Decision Making Pt presented with atypical neurological symptoms. They have fully resolved at time of evaluation. Concern for stroke vs TIA vs migraine vs psychogenic. Vitals stable, moderate hypertension. Exam unremarkable, no carotid bruits, heart normal, neurologically intact. She did have imaging - CT head and chest XR - that was normal. Can rule out stroke. Troponin, CBC, BMP are unremarkable. She does have risk factors for vascular disease including HLD, prediabetes but no diabetes. ABCD score for TIA is 3 with low risk. Unilateral symptoms/distribution in L face and UE does not favor a vascular insult. Given her history of migraines, favor atypical migraine aura as cause. Workup does not suggest acute illness, structural abnormality, or metabolic  condition. She is safe for discharge home with neurology follow-up, patient directed to contact for that.  Amount and/or Complexity of Data Reviewed Labs: ordered.    Details: CBC and BMP are WNL Radiology: ordered.    Details: CT head and CXR WNL negative for acute process including stroke.          Final Clinical Impression(s) / ED Diagnoses Final diagnoses:  Weakness  Atypical migraine    Rx / DC Orders ED Discharge Orders     None         Katheran James, DO 05/12/23 2312    Glyn Ade, MD 05/13/23 1711

## 2023-05-12 NOTE — Discharge Instructions (Addendum)
You were seen today with very atypical neurologic symptoms.  That does not appear to be a stroke or other vascular disease at this time. Will refer you to neurology in the outpatient setting for ongoing care and management.  Please call the attached phone number to make your appointment. In the interval continue with your medications as prescribed and follow-up with your PCP for your blood pressure management.

## 2023-05-12 NOTE — ED Notes (Signed)
Notified Countryman, MD re: pt's CC
